# Patient Record
Sex: Female | Born: 1995 | Race: White | Hispanic: No | Marital: Married | Smoking: Former smoker
Health system: Southern US, Community
[De-identification: ages and names within clinical notes are randomized; demographics above are authoritative.]

## PROBLEM LIST (undated history)

## (undated) ENCOUNTER — Inpatient Hospital Stay (HOSPITAL_COMMUNITY): Payer: Self-pay

## (undated) DIAGNOSIS — F319 Bipolar disorder, unspecified: Secondary | ICD-10-CM

## (undated) DIAGNOSIS — F329 Major depressive disorder, single episode, unspecified: Secondary | ICD-10-CM

## (undated) DIAGNOSIS — F99 Mental disorder, not otherwise specified: Secondary | ICD-10-CM

## (undated) DIAGNOSIS — F32A Depression, unspecified: Secondary | ICD-10-CM

## (undated) DIAGNOSIS — R339 Retention of urine, unspecified: Secondary | ICD-10-CM

## (undated) DIAGNOSIS — R630 Anorexia: Secondary | ICD-10-CM

## (undated) DIAGNOSIS — D649 Anemia, unspecified: Secondary | ICD-10-CM

## (undated) DIAGNOSIS — F909 Attention-deficit hyperactivity disorder, unspecified type: Secondary | ICD-10-CM

## (undated) HISTORY — DX: Major depressive disorder, single episode, unspecified: F32.9

## (undated) HISTORY — DX: Bipolar disorder, unspecified: F31.9

## (undated) HISTORY — PX: EYE SURGERY: SHX253

## (undated) HISTORY — DX: Mental disorder, not otherwise specified: F99

## (undated) HISTORY — DX: Attention-deficit hyperactivity disorder, unspecified type: F90.9

## (undated) HISTORY — DX: Depression, unspecified: F32.A

## (undated) HISTORY — DX: Retention of urine, unspecified: R33.9

---

## 2015-08-16 NOTE — L&D Delivery Note (Signed)
20 G1 now P1 who was a post dates induction. Developed Tripple I and received Ampcillin and Gentamycin in labor.   Operative Delivery Note At 1:47 PM a viable female was delivered via VAVD .  Presentation: vertex; Position: Left,, Occiput,; Station: +2.  Verbal consent: obtained from patient.  Risks and benefits discussed in detail.  Risks include, but are not limited to the risks of anesthesia, bleeding, infection, damage to maternal tissues, fetal cephalhematoma.  There is also the risk of inability to effect vaginal delivery of the head, or shoulder dystocia that cannot be resolved by established maneuvers, leading to the need for emergency cesarean section.  APGAR: 5, 8; weight pending .   Placenta status: delivered with gentle traction intact.  Cord:  3 vessel with the following complications: small area of avulsion approximately 8cm from fetus bleeding immediately following delivery .  Cord pH: 7.2  Anesthesia:  epidural Instruments: kiwi vaccuum Episiotomy:  none Lacerations:  2nd degree posterior Suture Repair: 3.0 vicryl Est. Blood Loss (mL):  500  Mom to postpartum.  Baby to Couplet care / Skin to Skin.  Allison Shaw 05/19/2016, 2:21 PM

## 2015-10-28 LAB — OB RESULTS CONSOLE ANTIBODY SCREEN: Antibody Screen: NEGATIVE

## 2015-10-28 LAB — OB RESULTS CONSOLE ABO/RH: RH TYPE: POSITIVE

## 2015-10-28 LAB — OB RESULTS CONSOLE GC/CHLAMYDIA
CHLAMYDIA, DNA PROBE: NEGATIVE
Gonorrhea: NEGATIVE

## 2015-10-28 LAB — OB RESULTS CONSOLE HEPATITIS B SURFACE ANTIGEN: Hepatitis B Surface Ag: NEGATIVE

## 2015-10-28 LAB — OB RESULTS CONSOLE RUBELLA ANTIBODY, IGM: Rubella: IMMUNE

## 2015-10-28 LAB — OB RESULTS CONSOLE RPR: RPR: NONREACTIVE

## 2015-10-29 LAB — OB RESULTS CONSOLE PLATELET COUNT: Platelets: 283 10*3/uL

## 2015-10-29 LAB — OB RESULTS CONSOLE HGB/HCT, BLOOD
HEMATOCRIT: 41 %
Hemoglobin: 13.5 g/dL

## 2015-10-29 LAB — OB RESULTS CONSOLE HIV ANTIBODY (ROUTINE TESTING): HIV: NONREACTIVE

## 2015-11-25 LAB — CYSTIC FIBROSIS DIAGNOSTIC STUDY: Interpretation-CFDNA:: NEGATIVE

## 2016-02-18 LAB — OB RESULTS CONSOLE RPR: RPR: NONREACTIVE

## 2016-02-18 LAB — OB RESULTS CONSOLE HIV ANTIBODY (ROUTINE TESTING): HIV: NONREACTIVE

## 2016-03-16 ENCOUNTER — Encounter (HOSPITAL_COMMUNITY): Payer: Self-pay | Admitting: *Deleted

## 2016-03-16 ENCOUNTER — Inpatient Hospital Stay (HOSPITAL_COMMUNITY)
Admission: AD | Admit: 2016-03-16 | Discharge: 2016-03-16 | Disposition: A | Discharge status: Home / Self Care | Payer: Medicaid Other | Source: Ambulatory Visit | Attending: Obstetrics and Gynecology | Admitting: Obstetrics and Gynecology

## 2016-03-16 DIAGNOSIS — O99613 Diseases of the digestive system complicating pregnancy, third trimester: Secondary | ICD-10-CM | POA: Insufficient documentation

## 2016-03-16 DIAGNOSIS — R109 Unspecified abdominal pain: Secondary | ICD-10-CM | POA: Diagnosis present

## 2016-03-16 DIAGNOSIS — Z3A32 32 weeks gestation of pregnancy: Secondary | ICD-10-CM | POA: Diagnosis not present

## 2016-03-16 DIAGNOSIS — K219 Gastro-esophageal reflux disease without esophagitis: Secondary | ICD-10-CM | POA: Diagnosis not present

## 2016-03-16 LAB — URINALYSIS, ROUTINE W REFLEX MICROSCOPIC
BILIRUBIN URINE: NEGATIVE
Glucose, UA: NEGATIVE mg/dL
Hgb urine dipstick: NEGATIVE
Ketones, ur: NEGATIVE mg/dL
Leukocytes, UA: NEGATIVE
NITRITE: NEGATIVE
PROTEIN: NEGATIVE mg/dL
SPECIFIC GRAVITY, URINE: 1.02 (ref 1.005–1.030)
pH: 6 (ref 5.0–8.0)

## 2016-03-16 LAB — WET PREP, GENITAL
Clue Cells Wet Prep HPF POC: NONE SEEN
SPERM: NONE SEEN
TRICH WET PREP: NONE SEEN
YEAST WET PREP: NONE SEEN

## 2016-03-16 MED ORDER — FAMOTIDINE 40 MG PO TABS: 40.0000 mg | ORAL_TABLET | Freq: Every day | ORAL | 1 refills | Status: DC

## 2016-03-16 MED ORDER — GI COCKTAIL ~~LOC~~
30.0000 mL | Freq: Once | ORAL | Status: AC
  Administered 2016-03-16: 30 mL via ORAL
  Filled 2016-03-16: qty 30

## 2016-03-16 NOTE — Discharge Instructions (Signed)
Gastroesophageal Reflux Disease, Adult Normally, food travels down the esophagus and stays in the stomach to be digested. However, when a person has gastroesophageal reflux disease (GERD), food and stomach acid move back up into the esophagus. When this happens, the esophagus becomes sore and inflamed. Over time, GERD can create small holes (ulcers) in the lining of the esophagus.  CAUSES This condition is caused by a problem with the muscle between the esophagus and the stomach (lower esophageal sphincter, or LES). Normally, the LES muscle closes after food passes through the esophagus to the stomach. When the LES is weakened or abnormal, it does not close properly, and that allows food and stomach acid to go back up into the esophagus. The LES can be weakened by certain dietary substances, medicines, and medical conditions, including:  Tobacco use.  Pregnancy.  Having a hiatal hernia.  Heavy alcohol use.  Certain foods and beverages, such as coffee, chocolate, onions, and peppermint. RISK FACTORS This condition is more likely to develop in:  People who have an increased body weight.  People who have connective tissue disorders.  People who use NSAID medicines. SYMPTOMS Symptoms of this condition include:  Heartburn.  Difficult or painful swallowing.  The feeling of having a lump in the throat.  Abitter taste in the mouth.  Bad breath.  Having a large amount of saliva.  Having an upset or bloated stomach.  Belching.  Chest pain.  Shortness of breath or wheezing.  Ongoing (chronic) cough or a night-time cough.  Wearing away of tooth enamel.  Weight loss. Different conditions can cause chest pain. Make sure to see your health care provider if you experience chest pain. DIAGNOSIS Your health care provider will take a medical history and perform a physical exam. To determine if you have mild or severe GERD, your health care provider may also monitor how you respond  to treatment. You may also have other tests, including:  An endoscopy toexamine your stomach and esophagus with a small camera.  A test thatmeasures the acidity level in your esophagus.  A test thatmeasures how much pressure is on your esophagus.  A barium swallow or modified barium swallow to show the shape, size, and functioning of your esophagus. TREATMENT The goal of treatment is to help relieve your symptoms and to prevent complications. Treatment for this condition may vary depending on how severe your symptoms are. Your health care provider may recommend:  Changes to your diet.  Medicine.  Surgery. HOME CARE INSTRUCTIONS Diet  Follow a diet as recommended by your health care provider. This may involve avoiding foods and drinks such as:  Coffee and tea (with or without caffeine).  Drinks that containalcohol.  Energy drinks and sports drinks.  Carbonated drinks or sodas.  Chocolate and cocoa.  Peppermint and mint flavorings.  Garlic and onions.  Horseradish.  Spicy and acidic foods, including peppers, chili powder, curry powder, vinegar, hot sauces, and barbecue sauce.  Citrus fruit juices and citrus fruits, such as oranges, lemons, and limes.  Tomato-based foods, such as red sauce, chili, salsa, and pizza with red sauce.  Fried and fatty foods, such as donuts, french fries, potato chips, and high-fat dressings.  High-fat meats, such as hot dogs and fatty cuts of red and white meats, such as rib eye steak, sausage, ham, and bacon.  High-fat dairy items, such as whole milk, butter, and cream cheese.  Eat small, frequent meals instead of large meals.  Avoid drinking large amounts of liquid with your  meals.  Avoid eating meals during the 2-3 hours before bedtime.  Avoid lying down right after you eat.  Do not exercise right after you eat. General Instructions  Pay attention to any changes in your symptoms.  Take over-the-counter and prescription  medicines only as told by your health care provider. Do not take aspirin, ibuprofen, or other NSAIDs unless your health care provider told you to do so.  Do not use any tobacco products, including cigarettes, chewing tobacco, and e-cigarettes. If you need help quitting, ask your health care provider.  Wear loose-fitting clothing. Do not wear anything tight around your waist that causes pressure on your abdomen.  Raise (elevate) the head of your bed 6 inches (15cm).  Try to reduce your stress, such as with yoga or meditation. If you need help reducing stress, ask your health care provider.  If you are overweight, reduce your weight to an amount that is healthy for you. Ask your health care provider for guidance about a safe weight loss goal.  Keep all follow-up visits as told by your health care provider. This is important. SEEK MEDICAL CARE IF:  You have new symptoms.  You have unexplained weight loss.  You have difficulty swallowing, or it hurts to swallow.  You have wheezing or a persistent cough.  Your symptoms do not improve with treatment.  You have a hoarse voice. SEEK IMMEDIATE MEDICAL CARE IF:  You have pain in your arms, neck, jaw, teeth, or back.  You feel sweaty, dizzy, or light-headed.  You have chest pain or shortness of breath.  You vomit and your vomit looks like blood or coffee grounds.  You faint.  Your stool is bloody or black.  You cannot swallow, drink, or eat.   This information is not intended to replace advice given to you by your health care provider. Make sure you discuss any questions you have with your health care provider.   Document Released: 05/11/2005 Document Revised: 04/22/2015 Document Reviewed: 11/26/2014 Elsevier Interactive Patient Education 2016 [employer]. Preterm Labor Information Preterm labor is when labor starts at less than 37 weeks of pregnancy. The normal length of a pregnancy is 39 to 41 weeks. CAUSES Often, there is  no identifiable underlying cause as to why a woman goes into preterm labor. One of the most common known causes of preterm labor is infection. Infections of the uterus, cervix, vagina, amniotic sac, bladder, kidney, or even the lungs (pneumonia) can cause labor to start. Other suspected causes of preterm labor include:   Urogenital infections, such as yeast infections and bacterial vaginosis.   Uterine abnormalities (uterine shape, uterine septum, fibroids, or bleeding from the placenta).   A cervix that has been operated on (it may fail to stay closed).   Malformations in the fetus.   Multiple gestations (twins, triplets, and so on).   Breakage of the amniotic sac.  RISK FACTORS  Having a previous history of preterm labor.   Having premature rupture of membranes (PROM).   Having a placenta that covers the opening of the cervix (placenta previa).   Having a placenta that separates from the uterus (placental abruption).   Having a cervix that is too weak to hold the fetus in the uterus (incompetent cervix).   Having too much fluid in the amniotic sac (polyhydramnios).   Taking illegal drugs or smoking while pregnant.   Not gaining enough weight while pregnant.   Being younger than 30 and older than 20 years old.   Having  a low socioeconomic status.   Being African American. SYMPTOMS Signs and symptoms of preterm labor include:   Menstrual-like cramps, abdominal pain, or back pain.  Uterine contractions that are regular, as frequent as six in an hour, regardless of their intensity (may be mild or painful).  Contractions that start on the top of the uterus and spread down to the lower abdomen and back.   A sense of increased pelvic pressure.   A watery or bloody mucus discharge that comes from the vagina.  TREATMENT Depending on the length of the pregnancy and other circumstances, your health care provider may suggest bed rest. If necessary, there are  medicines that can be given to stop contractions and to mature the fetal lungs. If labor happens before 34 weeks of pregnancy, a prolonged hospital stay may be recommended. Treatment depends on the condition of both you and the fetus.  WHAT SHOULD YOU DO IF YOU THINK YOU ARE IN PRETERM LABOR? Call your health care provider right away. You will need to go to the hospital to get checked immediately. HOW CAN YOU PREVENT PRETERM LABOR IN FUTURE PREGNANCIES? You should:   Stop smoking if you smoke.  Maintain healthy weight gain and avoid chemicals and drugs that are not necessary.  Be watchful for any type of infection.  Inform your health care provider if you have a known history of preterm labor.   This information is not intended to replace advice given to you by your health care provider. Make sure you discuss any questions you have with your health care provider.   Document Released: 10/22/2003 Document Revised: 04/03/2013 Document Reviewed: 09/03/2012 Elsevier Interactive Patient Education [employer]

## 2016-03-16 NOTE — MAU Note (Signed)
Pain in stomach

## 2016-03-16 NOTE — MAU Provider Note (Signed)
History     CSN: [id number]  Arrival date and time: 03/16/16 1945   First Provider Initiated Contact with Patient 03/16/16 2009      Chief Complaint  Patient presents with  . Abdominal Pain   Allison Shaw is a 20 y.o. G1P0 at [redacted]w[redacted]d who presents today with abdominal pain. She states that the pain started this morning. She denies any VB or LOF. She reports that the fetus is moving normally. She states that she was seen in [facility] for "leaking of fluid" in June, and states that they told her that her water was not broken. She denies any complications with the pregnancy at this time.    Abdominal Pain  This is a new problem. The current episode started today (at 0500 ). The onset quality is sudden. The problem occurs constantly. The problem has been unchanged. The pain is located in the epigastric region. The pain is at a severity of 6/10. The quality of the pain is sharp. The abdominal pain radiates to the suprapubic region. Pertinent negatives include no constipation, diarrhea, dysuria, fever, frequency, nausea or vomiting. Nothing aggravates the pain. The pain is relieved by nothing. She has tried nothing for the symptoms.    No past medical history on file.  No past surgical history on file.  No family history on file.  Social History  Substance Use Topics  . Smoking status: Not on file  . Smokeless tobacco: Not on file  . Alcohol use Not on file    Allergies:  Allergies  Allergen Reactions  . Latex Itching and Other (See Comments)    Causes burning.  . Fish Allergy Rash    Prescriptions Prior to Admission  Medication Sig Dispense Refill Last Dose  . acetaminophen (TYLENOL) 325 MG tablet Take 325 mg by mouth every 6 (six) hours as needed for mild pain or headache.   03/16/2016 at Unknown time  . calcium carbonate (TUMS - DOSED IN MG ELEMENTAL CALCIUM) 500 MG chewable tablet Chew 1 tablet by mouth 2 (two) times daily as needed for indigestion or heartburn.   03/16/2016 at  Unknown time  . Prenatal Vit-Fe Fumarate-FA (PRENATAL MULTIVITAMIN) TABS tablet Take 1 tablet by mouth at bedtime.   03/15/2016 at Unknown time    Review of Systems  Constitutional: Negative for chills and fever.  Gastrointestinal: Positive for abdominal pain. Negative for constipation, diarrhea, nausea and vomiting.  Genitourinary: Negative for dysuria, frequency and urgency.   Physical Exam   Blood pressure 129/73, pulse 99, temperature 97.5 F (36.4 C), temperature source Oral, resp. rate 18.  Physical Exam  Nursing note and vitals reviewed. Constitutional: She is oriented to person, place, and time. She appears well-developed and well-nourished. No distress.  HENT:  Head: Normocephalic.  Cardiovascular: Normal rate.   Respiratory: Effort normal.  GI: Soft. There is no tenderness. There is no rebound.  Genitourinary:  Genitourinary Comments:  External: no lesion Vagina: small amount of white discharge Cervix: pink, smooth, closed/thick  Uterus: AGA   Neurological: She is alert and oriented to person, place, and time.  Skin: Skin is warm and dry.  Psychiatric: She has a normal mood and affect.   FHT: 135, moderate with 15x15 accels, no decels Toco: no UCs  MAU Course  Procedures  MDM Patient has had GI cocktail. She reports that her pain is now 0/10   Assessment and Plan   1. Gastroesophageal reflux disease, esophagitis presence not specified   2. [redacted] weeks gestation of pregnancy  DC home Comfort measures reviewed  3rd Trimester precautions  PTL precautions  Fetal kick counts RX: pepcid QD #30  Return to MAU as needed FU with OB as planned  Follow-up Information    Lin Landsman, MD .   Specialty:  Obstetrics and Gynecology Contact information: [address] [place]  [place] [address] [phone]            Tawnya Crook 03/16/2016, 8:13 PM

## 2016-03-17 LAB — GC/CHLAMYDIA PROBE AMP (~~LOC~~) NOT AT ARMC
Chlamydia: NEGATIVE
NEISSERIA GONORRHEA: NEGATIVE

## 2016-03-23 ENCOUNTER — Ambulatory Visit (INDEPENDENT_AMBULATORY_CARE_PROVIDER_SITE_OTHER): Payer: Medicaid Other | Admitting: Family Medicine

## 2016-03-23 ENCOUNTER — Encounter: Payer: Self-pay | Admitting: Family Medicine

## 2016-03-23 DIAGNOSIS — O358XX0 Maternal care for other (suspected) fetal abnormality and damage, not applicable or unspecified: Secondary | ICD-10-CM

## 2016-03-23 DIAGNOSIS — O0993 Supervision of high risk pregnancy, unspecified, third trimester: Secondary | ICD-10-CM

## 2016-03-23 DIAGNOSIS — O35BXX0 Maternal care for other (suspected) fetal abnormality and damage, fetal cardiac anomalies, not applicable or unspecified: Secondary | ICD-10-CM

## 2016-03-23 LAB — POCT URINALYSIS DIP (DEVICE)
Bilirubin Urine: NEGATIVE
Glucose, UA: NEGATIVE mg/dL
Hgb urine dipstick: NEGATIVE
KETONES UR: NEGATIVE mg/dL
Leukocytes, UA: NEGATIVE
Nitrite: NEGATIVE
PH: 7 (ref 5.0–8.0)
PROTEIN: NEGATIVE mg/dL
Specific Gravity, Urine: 1.02 (ref 1.005–1.030)
Urobilinogen, UA: 1 mg/dL (ref 0.0–1.0)

## 2016-03-24 DIAGNOSIS — O0990 Supervision of high risk pregnancy, unspecified, unspecified trimester: Secondary | ICD-10-CM | POA: Insufficient documentation

## 2016-03-24 DIAGNOSIS — O35BXX0 Maternal care for other (suspected) fetal abnormality and damage, fetal cardiac anomalies, not applicable or unspecified: Secondary | ICD-10-CM | POA: Insufficient documentation

## 2016-03-24 DIAGNOSIS — O358XX0 Maternal care for other (suspected) fetal abnormality and damage, not applicable or unspecified: Secondary | ICD-10-CM | POA: Insufficient documentation

## 2016-03-24 NOTE — Patient Instructions (Addendum)
Breastfeeding Deciding to breastfeed is one of the best choices you can make for you and your baby. A change in hormones during pregnancy causes your breast tissue to grow and increases the number and size of your milk ducts. These hormones also allow proteins, sugars, and fats from your blood supply to make breast milk in your milk-producing glands. Hormones prevent breast milk from being released before your baby is born as well as prompt milk flow after birth. Once breastfeeding has begun, thoughts of your baby, as well as his or her sucking or crying, can stimulate the release of milk from your milk-producing glands.  BENEFITS OF BREASTFEEDING For Your Baby  Your first milk (colostrum) helps your baby's digestive system function better.  There are antibodies in your milk that help your baby fight off infections.  Your baby has a lower incidence of asthma, allergies, and sudden infant death syndrome.  The nutrients in breast milk are better for your baby than infant formulas and are designed uniquely for your baby's needs.  Breast milk improves your baby's brain development.  Your baby is less likely to develop other conditions, such as childhood obesity, asthma, or type 2 diabetes mellitus. For You  Breastfeeding helps to create a very special bond between you and your baby.  Breastfeeding is convenient. Breast milk is always available at the correct temperature and costs nothing.  Breastfeeding helps to burn calories and helps you lose the weight gained during pregnancy.  Breastfeeding makes your uterus contract to its prepregnancy size faster and slows bleeding (lochia) after you give birth.   Breastfeeding helps to lower your risk of developing type 2 diabetes mellitus, osteoporosis, and breast or ovarian cancer later in life. SIGNS THAT YOUR BABY IS HUNGRY Early Signs of Hunger  Increased alertness or activity.  Stretching.  Movement of the head from side to  side.  Movement of the head and opening of the mouth when the corner of the mouth or cheek is stroked (rooting).  Increased sucking sounds, smacking lips, cooing, sighing, or squeaking.  Hand-to-mouth movements.  Increased sucking of fingers or hands. Late Signs of Hunger  Fussing.  Intermittent crying. Extreme Signs of Hunger Signs of extreme hunger will require calming and consoling before your baby will be able to breastfeed successfully. Do not wait for the following signs of extreme hunger to occur before you initiate breastfeeding:  Restlessness.  A loud, strong cry.  Screaming. BREASTFEEDING BASICS Breastfeeding Initiation  Find a comfortable place to sit or lie down, with your neck and back well supported.  Place a pillow or rolled up blanket under your baby to bring him or her to the level of your breast (if you are seated). Nursing pillows are specially designed to help support your arms and your baby while you breastfeed.  Make sure that your baby's abdomen is facing your abdomen.  Gently massage your breast. With your fingertips, massage from your chest wall toward your nipple in a circular motion. This encourages milk flow. You may need to continue this action during the feeding if your milk flows slowly.  Support your breast with 4 fingers underneath and your thumb above your nipple. Make sure your fingers are well away from your nipple and your baby's mouth.  Stroke your baby's lips gently with your finger or nipple.  When your baby's mouth is open wide enough, quickly bring your baby to your breast, placing your entire nipple and as much of the colored area around your nipple (  areola) as possible into your baby's mouth.  More areola should be visible above your baby's upper lip than below the lower lip.  Your baby's tongue should be between his or her lower gum and your breast.  Ensure that your baby's mouth is correctly positioned around your nipple  (latched). Your baby's lips should create a seal on your breast and be turned out (everted).  It is common for your baby to suck about 2-3 minutes in order to start the flow of breast milk. Latching Teaching your baby how to latch on to your breast properly is very important. An improper latch can cause nipple pain and decreased milk supply for you and poor weight gain in your baby. Also, if your baby is not latched onto your nipple properly, he or she may swallow some air during feeding. This can make your baby fussy. Burping your baby when you switch breasts during the feeding can help to get rid of the air. However, teaching your baby to latch on properly is still the best way to prevent fussiness from swallowing air while breastfeeding. Signs that your baby has successfully latched on to your nipple:  Silent tugging or silent sucking, without causing you pain.  Swallowing heard between every 3-4 sucks.  Muscle movement above and in front of his or her ears while sucking. Signs that your baby has not successfully latched on to nipple:  Sucking sounds or smacking sounds from your baby while breastfeeding.  Nipple pain. If you think your baby has not latched on correctly, slip your finger into the corner of your baby's mouth to break the suction and place it between your baby's gums. Attempt breastfeeding initiation again. Signs of Successful Breastfeeding Signs from your baby:  A gradual decrease in the number of sucks or complete cessation of sucking.  Falling asleep.  Relaxation of his or her body.  Retention of a small amount of milk in his or her mouth.  Letting go of your breast by himself or herself. Signs from you:  Breasts that have increased in firmness, weight, and size 1-3 hours after feeding.  Breasts that are softer immediately after breastfeeding.  Increased milk volume, as well as a change in milk consistency and color by the fifth day of breastfeeding.  Nipples  that are not sore, cracked, or bleeding. Signs That Your Baby is Getting Enough Milk  Wetting at least 3 diapers in a 24-hour period. The urine should be clear and pale yellow by age 5 days.  At least 3 stools in a 24-hour period by age 5 days. The stool should be soft and yellow.  At least 3 stools in a 24-hour period by age 7 days. The stool should be seedy and yellow.  No loss of weight greater than 10% of birth weight during the first 3 days of age.  Average weight gain of 4-7 ounces (113-198 g) per week after age 4 days.  Consistent daily weight gain by age 5 days, without weight loss after the age of 2 weeks. After a feeding, your baby may spit up a small amount. This is common. BREASTFEEDING FREQUENCY AND DURATION Frequent feeding will help you make more milk and can prevent sore nipples and breast engorgement. Breastfeed when you feel the need to reduce the fullness of your breasts or when your baby shows signs of hunger. This is called "breastfeeding on demand." Avoid introducing a pacifier to your baby while you are working to establish breastfeeding (the first 4-6 weeks   after your baby is born). After this time you may choose to use a pacifier. Research has shown that pacifier use during the first year of a baby's life decreases the risk of sudden infant death syndrome (SIDS). Allow your baby to feed on each breast as long as he or she wants. Breastfeed until your baby is finished feeding. When your baby unlatches or falls asleep while feeding from the first breast, offer the second breast. Because newborns are often sleepy in the first few weeks of life, you may need to awaken your baby to get him or her to feed. Breastfeeding times will vary from baby to baby. However, the following rules can serve as a guide to help you ensure that your baby is properly fed:  Newborns (babies 4 weeks of age or younger) may breastfeed every 1-3 hours.  Newborns should not go longer than 3 hours  during the day or 5 hours during the night without breastfeeding.  You should breastfeed your baby a minimum of 8 times in a 24-hour period until you begin to introduce solid foods to your baby at around 6 months of age. BREAST MILK PUMPING Pumping and storing breast milk allows you to ensure that your baby is exclusively fed your breast milk, even at times when you are unable to breastfeed. This is especially important if you are going back to work while you are still breastfeeding or when you are not able to be present during feedings. Your lactation consultant can give you guidelines on how long it is safe to store breast milk. A breast pump is a machine that allows you to pump milk from your breast into a sterile bottle. The pumped breast milk can then be stored in a refrigerator or freezer. Some breast pumps are operated by hand, while others use electricity. Ask your lactation consultant which type will work best for you. Breast pumps can be purchased, but some hospitals and breastfeeding support groups lease breast pumps on a monthly basis. A lactation consultant can teach you how to hand express breast milk, if you prefer not to use a pump. CARING FOR YOUR BREASTS WHILE YOU BREASTFEED Nipples can become dry, cracked, and sore while breastfeeding. The following recommendations can help keep your breasts moisturized and healthy:  Avoid using soap on your nipples.  Wear a supportive bra. Although not required, special nursing bras and tank tops are designed to allow access to your breasts for breastfeeding without taking off your entire bra or top. Avoid wearing underwire-style bras or extremely tight bras.  Air dry your nipples for 3-4minutes after each feeding.  Use only cotton bra pads to absorb leaked breast milk. Leaking of breast milk between feedings is normal.  Use lanolin on your nipples after breastfeeding. Lanolin helps to maintain your skin's normal moisture barrier. If you use  pure lanolin, you do not need to wash it off before feeding your baby again. Pure lanolin is not toxic to your baby. You may also hand express a few drops of breast milk and gently massage that milk into your nipples and allow the milk to air dry. In the first few weeks after giving birth, some women experience extremely full breasts (engorgement). Engorgement can make your breasts feel heavy, warm, and tender to the touch. Engorgement peaks within 3-5 days after you give birth. The following recommendations can help ease engorgement:  Completely empty your breasts while breastfeeding or pumping. You may want to start by applying warm, moist heat (in   the shower or with warm water-soaked hand towels) just before feeding or pumping. This increases circulation and helps the milk flow. If your baby does not completely empty your breasts while breastfeeding, pump any extra milk after he or she is finished.  Wear a snug bra (nursing or regular) or tank top for 1-2 days to signal your body to slightly decrease milk production.  Apply ice packs to your breasts, unless this is too uncomfortable for you.  Make sure that your baby is latched on and positioned properly while breastfeeding. If engorgement persists after 48 hours of following these recommendations, contact your health care provider or a lactation consultant. OVERALL HEALTH CARE RECOMMENDATIONS WHILE BREASTFEEDING  Eat healthy foods. Alternate between meals and snacks, eating 3 of each per day. Because what you eat affects your breast milk, some of the foods may make your baby more irritable than usual. Avoid eating these foods if you are sure that they are negatively affecting your baby.  Drink milk, fruit juice, and water to satisfy your thirst (about 10 glasses a day).  Rest often, relax, and continue to take your prenatal vitamins to prevent fatigue, stress, and anemia.  Continue breast self-awareness checks.  Avoid chewing and smoking  tobacco. Chemicals from cigarettes that pass into breast milk and exposure to secondhand smoke may harm your baby.  Avoid alcohol and drug use, including marijuana. Some medicines that may be harmful to your baby can pass through breast milk. It is important to ask your health care provider before taking any medicine, including all over-the-counter and prescription medicine as well as vitamin and herbal supplements. It is possible to become pregnant while breastfeeding. If birth control is desired, ask your health care provider about options that will be safe for your baby. SEEK MEDICAL CARE IF:  You feel like you want to stop breastfeeding or have become frustrated with breastfeeding.  You have painful breasts or nipples.  Your nipples are cracked or bleeding.  Your breasts are red, tender, or warm.  You have a swollen area on either breast.  You have a fever or chills.  You have nausea or vomiting.  You have drainage other than breast milk from your nipples.  Your breasts do not become full before feedings by the fifth day after you give birth.  You feel sad and depressed.  Your baby is too sleepy to eat well.  Your baby is having trouble sleeping.   Your baby is wetting less than 3 diapers in a 24-hour period.  Your baby has less than 3 stools in a 24-hour period.  Your baby's skin or the white part of his or her eyes becomes yellow.   Your baby is not gaining weight by 5 days of age. SEEK IMMEDIATE MEDICAL CARE IF:  Your baby is overly tired (lethargic) and does not want to wake up and feed.  Your baby develops an unexplained fever.   This information is not intended to replace advice given to you by your health care provider. Make sure you discuss any questions you have with your health care provider.   Document Released: 08/01/2005 Document Revised: 04/22/2015 Document Reviewed: 01/23/2013 Elsevier Interactive Patient Education 2016 [employer]  

## 2016-03-24 NOTE — Progress Notes (Signed)
Subjective:  Allison Shaw is a 20 y.o. G1P0 at 6029w2d being seen today for ongoing prenatal care.  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  Patient reports no complaints.  Contractions: Not present. Vag. Bleeding: None.  Movement: Present. Denies leaking of fluid.   The following portions of the patient's history were reviewed and updated as appropriate: allergies, current medications, past family history, past medical history, past social history, past surgical history and problem list. Problem list updated.  Objective:   Vitals:   03/23/16 1321 03/23/16 1339  BP: (!) 120/57   Pulse: 85   Weight: 145 lb 14.4 oz (66.2 kg)   Height:  5\' 3"  (1.6 m)    Fetal Status:     Movement: Present     General:  Alert, oriented and cooperative. Patient is in no acute distress.  Skin: Skin is warm and dry. No rash noted.   Cardiovascular: Normal heart rate noted  Respiratory: Normal respiratory effort, no problems with respiration noted  Abdomen: Soft, gravid, appropriate for gestational age. Pain/Pressure: Absent     Pelvic:  Cervical exam deferred        Extremities: Normal range of motion.  Edema: None  Mental Status: Normal mood and affect. Normal behavior. Normal judgment and thought content.   Urinalysis:      Assessment and Plan:  Pregnancy: G1P0 at 3529w2d  1. Supervision of high risk pregnancy, antepartum, third trimester Continue prenatal care. Need labs and records of u/s needed    2. 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 She has Fetal ECHO's with [facility]  Preterm labor symptoms and general obstetric precautions including but not limited to vaginal bleeding, contractions,  leaking of fluid and fetal movement were reviewed in detail with the patient. Please refer to After Visit Summary for other counseling recommendations.  Return in 2 weeks (on 04/06/2016).   Reva Boresanya S Zelig Gacek, MD

## 2016-03-25 ENCOUNTER — Encounter (HOSPITAL_COMMUNITY): Payer: Self-pay

## 2016-04-06 ENCOUNTER — Ambulatory Visit (INDEPENDENT_AMBULATORY_CARE_PROVIDER_SITE_OTHER): Payer: Medicaid Other | Admitting: Advanced Practice Midwife

## 2016-04-06 DIAGNOSIS — O35BXX0 Maternal care for other (suspected) fetal abnormality and damage, fetal cardiac anomalies, not applicable or unspecified: Secondary | ICD-10-CM

## 2016-04-06 DIAGNOSIS — O358XX0 Maternal care for other (suspected) fetal abnormality and damage, not applicable or unspecified: Secondary | ICD-10-CM

## 2016-04-06 DIAGNOSIS — O0993 Supervision of high risk pregnancy, unspecified, third trimester: Secondary | ICD-10-CM

## 2016-04-06 LAB — POCT URINALYSIS DIP (DEVICE)
BILIRUBIN URINE: NEGATIVE
Glucose, UA: NEGATIVE mg/dL
Hgb urine dipstick: NEGATIVE
Ketones, ur: NEGATIVE mg/dL
NITRITE: NEGATIVE
PH: 6.5 (ref 5.0–8.0)
Protein, ur: NEGATIVE mg/dL
Specific Gravity, Urine: 1.02 (ref 1.005–1.030)
UROBILINOGEN UA: 0.2 mg/dL (ref 0.0–1.0)

## 2016-04-06 NOTE — Progress Notes (Signed)
Subjective:  Allison Shaw is a 20 y.o. G1P0 at 3932w1d being seen today for ongoing prenatal care.  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  Patient reports occasional contractions.  Contractions: Irritability. Vag. Bleeding: None.  Movement: Present. Denies leaking of fluid.   The following portions of the patient's history were reviewed and updated as appropriate: allergies, current medications, past family history, past medical history, past social history, past surgical history and problem list. Problem list updated.  Objective:   Vitals:   04/06/16 0846  BP: 120/69  Pulse: (!) 108  Weight: 150 lb (68 kg)    Fetal Status: Fetal Heart Rate (bpm): 140 Fundal Height: 35 cm Movement: Present     General:  Alert, oriented and cooperative. Patient is in no acute distress.  Skin: Skin is warm and dry. No rash noted.   Cardiovascular: Normal heart rate noted  Respiratory: Normal respiratory effort, no problems with respiration noted  Abdomen: Soft, gravid, appropriate for gestational age. Pain/Pressure: Absent     Pelvic:  Cervical exam deferred        Extremities: Normal range of motion.  Edema: None  Mental Status: Normal mood and affect. Normal behavior. Normal judgment and thought content.   Urinalysis: Urine Protein: Negative Urine Glucose: Negative  Assessment and Plan:  Pregnancy: G1P0 at 4932w1d  1. Supervision of high risk pregnancy, antepartum, third trimester   2. 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   Preterm labor symptoms and general obstetric precautions including but not limited to vaginal bleeding, contractions, leaking of fluid and fetal movement were reviewed in detail with the  patient. Please refer to After Visit Summary for other counseling recommendations.  Return in about 1 week (around 04/13/2016).   Dorathy KinsmanVirginia Dustine Bertini, CNM

## 2016-04-06 NOTE — Patient Instructions (Addendum)
Braxton Hicks Contractions Contractions of the uterus can occur throughout pregnancy. Contractions are not always a sign that you are in labor.  WHAT ARE BRAXTON HICKS CONTRACTIONS?  Contractions that occur before labor are called Braxton Hicks contractions, or false labor. Toward the end of pregnancy (32-34 weeks), these contractions can develop more often and may become more forceful. This is not true labor because these contractions do not result in opening (dilatation) and thinning of the cervix. They are sometimes difficult to tell apart from true labor because these contractions can be forceful and people have different pain tolerances. You should not feel embarrassed if you go to the hospital with false labor. Sometimes, the only way to tell if you are in true labor is for your health care provider to look for changes in the cervix. If there are no prenatal problems or other health problems associated with the pregnancy, it is completely safe to be sent home with false labor and await the onset of true labor. HOW CAN YOU TELL THE DIFFERENCE BETWEEN TRUE AND FALSE LABOR? False Labor  The contractions of false labor are usually shorter and not as hard as those of true labor.   The contractions are usually irregular.   The contractions are often felt in the front of the lower abdomen and in the groin.   The contractions may go away when you walk around or change positions while lying down.   The contractions get weaker and are shorter lasting as time goes on.   The contractions do not usually become progressively stronger, regular, and closer together as with true labor.  True Labor  Contractions in true labor last 30-70 seconds, become very regular, usually become more intense, and increase in frequency.   The contractions do not go away with walking.   The discomfort is usually felt in the top of the uterus and spreads to the lower abdomen and low back.   True labor can be  determined by your health care provider with an exam. This will show that the cervix is dilating and getting thinner.  WHAT TO REMEMBER  Keep up with your usual exercises and follow other instructions given by your health care provider.   Take medicines as directed by your health care provider.   Keep your regular prenatal appointments.   Eat and drink lightly if you think you are going into labor.   If Braxton Hicks contractions are making you uncomfortable:   Change your position from lying down or resting to walking, or from walking to resting.   Sit and rest in a tub of warm water.   Drink 2-3 glasses of water. Dehydration may cause these contractions.   Do slow and deep breathing several times an hour.  WHEN SHOULD I SEEK IMMEDIATE MEDICAL CARE? Seek immediate medical care if:  Your contractions become stronger, more regular, and closer together.   You have fluid leaking or gushing from your vagina.   You have a fever.   You pass blood-tinged mucus.   You have vaginal bleeding.   You have continuous abdominal pain.   You have low back pain that you never had before.   You feel your baby's head pushing down and causing pelvic pressure.   Your baby is not moving as much as it used to.    This information is not intended to replace advice given to you by your health care provider. Make sure you discuss any questions you have with your health care  provider.   Document Released: 08/01/2005 Document Revised: 08/06/2013 Document Reviewed: 05/13/2013 Elsevier Interactive Patient Education 2016 Elsevier Inc.  Contraception Choices Contraception (birth control) is the use of any methods or devices to prevent pregnancy. Below are some methods to help avoid pregnancy. HORMONAL METHODS   Contraceptive implant. This is a thin, plastic tube containing progesterone hormone. It does not contain estrogen hormone. Your health care provider inserts the tube in  the inner part of the upper arm. The tube can remain in place for up to 3 years. After 3 years, the implant must be removed. The implant prevents the ovaries from releasing an egg (ovulation), thickens the cervical mucus to prevent sperm from entering the uterus, and thins the lining of the inside of the uterus.  Progesterone-only injections. These injections are given every 3 months by your health care provider to prevent pregnancy. This synthetic progesterone hormone stops the ovaries from releasing eggs. It also thickens cervical mucus and changes the uterine lining. This makes it harder for sperm to survive in the uterus.  Birth control pills. These pills contain estrogen and progesterone hormone. They work by preventing the ovaries from releasing eggs (ovulation). They also cause the cervical mucus to thicken, preventing the sperm from entering the uterus. Birth control pills are prescribed by a health care provider.Birth control pills can also be used to treat heavy periods.  Minipill. This type of birth control pill contains only the progesterone hormone. They are taken every day of each month and must be prescribed by your health care provider.  Birth control patch. The patch contains hormones similar to those in birth control pills. It must be changed once a week and is prescribed by a health care provider.  Vaginal ring. The ring contains hormones similar to those in birth control pills. It is left in the vagina for 3 weeks, removed for 1 week, and then a new one is put back in place. The patient must be comfortable inserting and removing the ring from the vagina.A health care provider's prescription is necessary.  Emergency contraception. Emergency contraceptives prevent pregnancy after unprotected sexual intercourse. This pill can be taken right after sex or up to 5 days after unprotected sex. It is most effective the sooner you take the pills after having sexual intercourse. Most emergency  contraceptive pills are available without a prescription. Check with your pharmacist. Do not use emergency contraception as your only form of birth control. BARRIER METHODS   Female condom. This is a thin sheath (latex or rubber) that is worn over the penis during sexual intercourse. It can be used with spermicide to increase effectiveness.  Female condom. This is a soft, loose-fitting sheath that is put into the vagina before sexual intercourse.  Diaphragm. This is a soft, latex, dome-shaped barrier that must be fitted by a health care provider. It is inserted into the vagina, along with a spermicidal jelly. It is inserted before intercourse. The diaphragm should be left in the vagina for 6 to 8 hours after intercourse.  Cervical cap. This is a round, soft, latex or plastic cup that fits over the cervix and must be fitted by a health care provider. The cap can be left in place for up to 48 hours after intercourse.  Sponge. This is a soft, circular piece of polyurethane foam. The sponge has spermicide in it. It is inserted into the vagina after wetting it and before sexual intercourse.  Spermicides. These are chemicals that kill or block sperm from entering   the cervix and uterus. They come in the form of creams, jellies, suppositories, foam, or tablets. They do not require a prescription. They are inserted into the vagina with an applicator before having sexual intercourse. The process must be repeated every time you have sexual intercourse. INTRAUTERINE CONTRACEPTION  Intrauterine device (IUD). This is a T-shaped device that is put in a woman's uterus during a menstrual period to prevent pregnancy. There are 2 types:  Copper IUD. This type of IUD is wrapped in copper wire and is placed inside the uterus. Copper makes the uterus and fallopian tubes produce a fluid that kills sperm. It can stay in place for 10 years.  Hormone IUD. This type of IUD contains the hormone progestin (synthetic  progesterone). The hormone thickens the cervical mucus and prevents sperm from entering the uterus, and it also thins the uterine lining to prevent implantation of a fertilized egg. The hormone can weaken or kill the sperm that get into the uterus. It can stay in place for 3-5 years, depending on which type of IUD is used. PERMANENT METHODS OF CONTRACEPTION  Female tubal ligation. This is when the woman's fallopian tubes are surgically sealed, tied, or blocked to prevent the egg from traveling to the uterus.  Hysteroscopic sterilization. This involves placing a small coil or insert into each fallopian tube. Your doctor uses a technique called hysteroscopy to do the procedure. The device causes scar tissue to form. This results in permanent blockage of the fallopian tubes, so the sperm cannot fertilize the egg. It takes about 3 months after the procedure for the tubes to become blocked. You must use another form of birth control for these 3 months.  Female sterilization. This is when the female has the tubes that carry sperm tied off (vasectomy).This blocks sperm from entering the vagina during sexual intercourse. After the procedure, the man can still ejaculate fluid (semen). NATURAL PLANNING METHODS  Natural family planning. This is not having sexual intercourse or using a barrier method (condom, diaphragm, cervical cap) on days the woman could become pregnant.  Calendar method. This is keeping track of the length of each menstrual cycle and identifying when you are fertile.  Ovulation method. This is avoiding sexual intercourse during ovulation.  Symptothermal method. This is avoiding sexual intercourse during ovulation, using a thermometer and ovulation symptoms.  Post-ovulation method. This is timing sexual intercourse after you have ovulated. Regardless of which type or method of contraception you choose, it is important that you use condoms to protect against the transmission of sexually  transmitted infections (STIs). Talk with your health care provider about which form of contraception is most appropriate for you.   This information is not intended to replace advice given to you by your health care provider. Make sure you discuss any questions you have with your health care provider.   Document Released: 08/01/2005 Document Revised: 08/06/2013 Document Reviewed: 01/24/2013 Elsevier Interactive Patient Education [employer]2016 Elsevier Inc.

## 2016-04-07 ENCOUNTER — Encounter: Payer: Self-pay | Admitting: *Deleted

## 2016-04-07 LAB — OB RESULTS CONSOLE HGB/HCT, BLOOD: HEMOGLOBIN: 11 g/dL

## 2016-04-08 ENCOUNTER — Encounter: Payer: Self-pay | Admitting: *Deleted

## 2016-04-20 DIAGNOSIS — F909 Attention-deficit hyperactivity disorder, unspecified type: Secondary | ICD-10-CM | POA: Insufficient documentation

## 2016-04-20 DIAGNOSIS — F3181 Bipolar II disorder: Secondary | ICD-10-CM | POA: Insufficient documentation

## 2016-04-26 ENCOUNTER — Ambulatory Visit (INDEPENDENT_AMBULATORY_CARE_PROVIDER_SITE_OTHER): Payer: Medicaid Other | Admitting: Advanced Practice Midwife

## 2016-04-26 ENCOUNTER — Other Ambulatory Visit (HOSPITAL_COMMUNITY)
Admission: RE | Admit: 2016-04-26 | Discharge: 2016-04-26 | Disposition: A | Discharge status: Home / Self Care | Payer: Medicaid Other | Source: Ambulatory Visit | Attending: Advanced Practice Midwife | Admitting: Advanced Practice Midwife

## 2016-04-26 VITALS — BP 121/59 | HR 98 | Wt 157.0 lb

## 2016-04-26 DIAGNOSIS — O35BXX0 Maternal care for other (suspected) fetal abnormality and damage, fetal cardiac anomalies, not applicable or unspecified: Secondary | ICD-10-CM

## 2016-04-26 DIAGNOSIS — Z113 Encounter for screening for infections with a predominantly sexual mode of transmission: Secondary | ICD-10-CM | POA: Insufficient documentation

## 2016-04-26 DIAGNOSIS — O358XX0 Maternal care for other (suspected) fetal abnormality and damage, not applicable or unspecified: Secondary | ICD-10-CM | POA: Diagnosis present

## 2016-04-26 DIAGNOSIS — O0993 Supervision of high risk pregnancy, unspecified, third trimester: Secondary | ICD-10-CM

## 2016-04-26 LAB — POCT URINALYSIS DIP (DEVICE)
Bilirubin Urine: NEGATIVE
GLUCOSE, UA: 100 mg/dL — AB
Hgb urine dipstick: NEGATIVE
Ketones, ur: NEGATIVE mg/dL
NITRITE: NEGATIVE
PROTEIN: NEGATIVE mg/dL
Specific Gravity, Urine: 1.025 (ref 1.005–1.030)
UROBILINOGEN UA: 1 mg/dL (ref 0.0–1.0)
pH: 6.5 (ref 5.0–8.0)

## 2016-04-26 NOTE — Progress Notes (Signed)
   PRENATAL VISIT NOTE  Subjective:  Allison Shaw is a 20 y.o. G1P0 at 3424w0d being seen today for ongoing prenatal care.  She is currently monitored for the following issues for this low-risk pregnancy and has Supervision of high risk pregnancy, antepartum; Pregnancy complicated by fetal congenital heart disease; Bipolar 2 disorder (HCC); and Attention deficit hyperactivity disorder (ADHD) on her problem list.  Patient reports occasional contractions.  Contractions: Irregular.  .  Movement: Present. Denies leaking of fluid.   The following portions of the patient's history were reviewed and updated as appropriate: allergies, current medications, past family history, past medical history, past social history, past surgical history and problem list. Problem list updated.  Objective:   Vitals:   04/26/16 1107  BP: (!) 121/59  Pulse: 98  Weight: 157 lb (71.2 kg)    Fetal Status: Fetal Heart Rate (bpm): 149 Fundal Height: 39 cm Movement: Present  Presentation: Vertex  General:  Alert, oriented and cooperative. Patient is in no acute distress.  Skin: Skin is warm and dry. No rash noted.   Cardiovascular: Normal heart rate noted  Respiratory: Normal respiratory effort, no problems with respiration noted  Abdomen: Soft, gravid, appropriate for gestational age. Pain/Pressure: Absent     Pelvic:  Cervical exam performed Dilation: Fingertip Effacement (%): 70 Station: -3  Extremities: Normal range of motion.     Mental Status: Normal mood and affect. Normal behavior. Normal judgment and thought content.   Urinalysis:      Assessment and Plan:  Pregnancy: G1P0 at 2424w0d  1. Supervision of high risk pregnancy, antepartum, third trimester  - Culture, beta strep (group b only) - GC[facility]Chlamydia probe amp [facility]  2. Pregnancy complicated by fetal congenital heart disease, not applicable or unspecified fetus --Pt had Fetal echo on 8/25 per pt and her husband.  Right atrial  enlargement still present but enlarged c/w fetal growth only.  [facility] will need cardiac echo prior to discharge from hospital.   Preterm labor symptoms and general obstetric precautions including but not limited to vaginal bleeding, contractions, leaking of fluid and fetal movement were reviewed in detail with the patient. Please refer to After Visit Summary for other counseling recommendations.  Return in about 1 week (around 05/03/2016).  Hurshel PartyLisa A Leftwich-Kirby, CNM

## 2016-04-27 LAB — GC/CHLAMYDIA PROBE AMP (~~LOC~~) NOT AT ARMC
CHLAMYDIA, DNA PROBE: NEGATIVE
Neisseria Gonorrhea: NEGATIVE

## 2016-04-28 LAB — CULTURE, BETA STREP (GROUP B ONLY)

## 2016-05-03 ENCOUNTER — Encounter (HOSPITAL_COMMUNITY): Payer: Self-pay | Admitting: *Deleted

## 2016-05-03 ENCOUNTER — Inpatient Hospital Stay (HOSPITAL_COMMUNITY)
Admission: AD | Admit: 2016-05-03 | Discharge: 2016-05-03 | Disposition: A | Discharge status: Home / Self Care | Payer: Medicaid Other | Source: Ambulatory Visit | Attending: Obstetrics and Gynecology | Admitting: Obstetrics and Gynecology

## 2016-05-03 DIAGNOSIS — N949 Unspecified condition associated with female genital organs and menstrual cycle: Secondary | ICD-10-CM

## 2016-05-03 DIAGNOSIS — M545 Low back pain: Secondary | ICD-10-CM | POA: Insufficient documentation

## 2016-05-03 DIAGNOSIS — Z3A39 39 weeks gestation of pregnancy: Secondary | ICD-10-CM | POA: Diagnosis not present

## 2016-05-03 DIAGNOSIS — O358XX1 Maternal care for other (suspected) fetal abnormality and damage, fetus 1: Secondary | ICD-10-CM

## 2016-05-03 DIAGNOSIS — O9989 Other specified diseases and conditions complicating pregnancy, childbirth and the puerperium: Secondary | ICD-10-CM

## 2016-05-03 DIAGNOSIS — O0993 Supervision of high risk pregnancy, unspecified, third trimester: Secondary | ICD-10-CM

## 2016-05-03 DIAGNOSIS — O35BXX1 Maternal care for other (suspected) fetal abnormality and damage, fetal cardiac anomalies, fetus 1: Secondary | ICD-10-CM

## 2016-05-03 DIAGNOSIS — O26893 Other specified pregnancy related conditions, third trimester: Secondary | ICD-10-CM | POA: Diagnosis not present

## 2016-05-03 LAB — URINALYSIS, ROUTINE W REFLEX MICROSCOPIC
Bilirubin Urine: NEGATIVE
GLUCOSE, UA: NEGATIVE mg/dL
HGB URINE DIPSTICK: NEGATIVE
KETONES UR: NEGATIVE mg/dL
Nitrite: NEGATIVE
PROTEIN: NEGATIVE mg/dL
Specific Gravity, Urine: 1.015 (ref 1.005–1.030)
pH: 7 (ref 5.0–8.0)

## 2016-05-03 LAB — URINE MICROSCOPIC-ADD ON

## 2016-05-03 NOTE — Discharge Instructions (Signed)
Braxton Hicks Contractions °Contractions of the uterus can occur throughout pregnancy. Contractions are not always a sign that you are in labor.  °WHAT ARE BRAXTON HICKS CONTRACTIONS?  °Contractions that occur before labor are called Braxton Hicks contractions, or false labor. Toward the end of pregnancy (32-34 weeks), these contractions can develop more often and may become more forceful. This is not true labor because these contractions do not result in opening (dilatation) and thinning of the cervix. They are sometimes difficult to tell apart from true labor because these contractions can be forceful and people have different pain tolerances. You should not feel embarrassed if you go to the hospital with false labor. Sometimes, the only way to tell if you are in true labor is for your health care provider to look for changes in the cervix. °If there are no prenatal problems or other health problems associated with the pregnancy, it is completely safe to be sent home with false labor and await the onset of true labor. °HOW CAN YOU TELL THE DIFFERENCE BETWEEN TRUE AND FALSE LABOR? °False Labor °· The contractions of false labor are usually shorter and not as hard as those of true labor.   °· The contractions are usually irregular.   °· The contractions are often felt in the front of the lower abdomen and in the groin.   °· The contractions may go away when you walk around or change positions while lying down.   °· The contractions get weaker and are shorter lasting as time goes on.   °· The contractions do not usually become progressively stronger, regular, and closer together as with true labor.   °True Labor °· Contractions in true labor last 30-70 seconds, become very regular, usually become more intense, and increase in frequency.   °· The contractions do not go away with walking.   °· The discomfort is usually felt in the top of the uterus and spreads to the lower abdomen and low back.   °· True labor can be  determined by your health care provider with an exam. This will show that the cervix is dilating and getting thinner.   °WHAT TO REMEMBER °· Keep up with your usual exercises and follow other instructions given by your health care provider.   °· Take medicines as directed by your health care provider.   °· Keep your regular prenatal appointments.   °· Eat and drink lightly if you think you are going into labor.   °· If Braxton Hicks contractions are making you uncomfortable:   °¨ Change your position from lying down or resting to walking, or from walking to resting.   °¨ Sit and rest in a tub of warm water.   °¨ Drink 2-3 glasses of water. Dehydration may cause these contractions.   °¨ Do slow and deep breathing several times an hour.   °WHEN SHOULD I SEEK IMMEDIATE MEDICAL CARE? °Seek immediate medical care if: °· Your contractions become stronger, more regular, and closer together.   °· You have fluid leaking or gushing from your vagina.   °· You have a fever.   °· You pass blood-tinged mucus.   °· You have vaginal bleeding.   °· You have continuous abdominal pain.   °· You have low back pain that you never had before.   °· You feel your baby's head pushing down and causing pelvic pressure.   °· Your baby is not moving as much as it used to.   °  °This information is not intended to replace advice given to you by your health care provider. Make sure you discuss any questions you have with your health care   provider. °  °Document Released: 08/01/2005 Document Revised: 08/06/2013 Document Reviewed: 05/13/2013 °Elsevier Interactive Patient Education ©2016 Elsevier Inc. ° °

## 2016-05-03 NOTE — MAU Note (Signed)
C/o back pain all night and today;

## 2016-05-03 NOTE — MAU Provider Note (Signed)
History     CSN: [id number]  Arrival date and time: 05/03/16 1435    Chief Complaint  Patient presents with  . Back Pain   Allison Shaw is a 20 y.o. G1P0 at 6841w0d who presents because father of baby thought could feel baby's head during sexual intercourse. Was concerned and wanted reassurance. Also having low back pain and has tried tylenol, heating pads, massage without relief. States lower back pain in associated with some occasional b/l lower abdominal pain; both usually occurs with movement or upon first standing. Denies LOF, vaginal bleeding. Endorses good fetal movement.     OB History    Gravida Para Term Preterm AB Living   1             SAB TAB Ectopic Multiple Live Births                  Past Medical History:  Diagnosis Date  . ADHD (attention deficit hyperactivity disorder)   . Bipolar 1 disorder (HCC)   . Depression   . Mental disorder     Past Surgical History:  Procedure Laterality Date  . 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 Mental illness Mother   . Depression Mother     Social History  Substance Use Topics  . Smoking status: Never Smoker  . Smokeless tobacco: Never Used  . Alcohol use No    Allergies:  Allergies  Allergen Reactions  . Latex Itching and Other (See Comments)    Causes burning.  . Fish Allergy Rash    Prescriptions Prior to Admission  Medication Sig Dispense Refill Last Dose  . acetaminophen (TYLENOL) 325 MG tablet Take 325 mg by mouth every 6 (six) hours as needed for mild pain or headache.   05/02/2016 at Unknown time  . calcium carbonate (TUMS - DOSED IN MG ELEMENTAL CALCIUM) 500 MG chewable tablet Chew 1 tablet by mouth 2 (two) times daily as needed for indigestion or heartburn.   Past Week at Unknown time  . famotidine (PEPCID) 40 MG tablet Take 1 tablet (40 mg total) by mouth daily. 30 tablet 1 05/02/2016 at Unknown time  . Prenatal Vit-Fe Fumarate-FA  (PRENATAL MULTIVITAMIN) TABS tablet Take 1 tablet by mouth at bedtime.   05/02/2016 at Unknown time    Review of Systems  Constitutional: Negative for chills and fever.  Eyes: Negative for blurred vision, double vision and photophobia.  Respiratory: Negative for shortness of breath.   Cardiovascular: Negative for chest pain.  Gastrointestinal: Negative for abdominal pain, nausea and vomiting.  Genitourinary: Negative for dysuria, frequency and urgency.  Musculoskeletal: Positive for back pain (b/l lower).  Neurological: Negative for headaches.   Physical Exam   Blood pressure 116/66, pulse 97, temperature 97.9 F (36.6 C), temperature source Oral, resp. rate 16, last menstrual period 07/26/2015.  Physical Exam  Constitutional: She is oriented to person, place, and time. She appears well-developed and well-nourished. No distress.  HENT:  Head: Normocephalic and atraumatic.  Cardiovascular: Normal rate, regular rhythm and normal heart sounds.   No murmur heard. Respiratory: Effort normal and breath sounds normal. No respiratory distress. She has no wheezes.  GI: Soft. Bowel sounds are normal. There is no tenderness. There is no rebound and no guarding.  Musculoskeletal: Normal range of motion. She exhibits no tenderness (no TTP over b/l lumbar region of back).  Neurological: She is alert and oriented to  person, place, and time.  Skin: Skin is warm and dry.  Psychiatric: She has a normal mood and affect.    MAU Course  Procedures  MDM NST reactive Cervical exam by RN: fingertip/thick/-3  Assessment and Plan  IUP@39 .0 - given return precautions and labor precautions  Leland Her PGY-1 05/03/2016, 3:45 PM   OB FELLOW MAU DISCHARGE ATTESTATION  I have seen and examined this patient; I agree with above documentation in the resident's note.   I personally reviewed the patient's NST today, found to be REACTIVE. 130 bpm, mod var, +accels, no decels. CTX: Rare.   Jen Mow, DO OB Fellow 5:29 PM

## 2016-05-04 ENCOUNTER — Ambulatory Visit (INDEPENDENT_AMBULATORY_CARE_PROVIDER_SITE_OTHER): Payer: Medicaid Other | Admitting: Advanced Practice Midwife

## 2016-05-04 ENCOUNTER — Encounter: Payer: Self-pay | Admitting: Advanced Practice Midwife

## 2016-05-04 VITALS — BP 128/73 | HR 116 | Wt 158.0 lb

## 2016-05-04 DIAGNOSIS — O0993 Supervision of high risk pregnancy, unspecified, third trimester: Secondary | ICD-10-CM

## 2016-05-04 LAB — POCT URINALYSIS DIP (DEVICE)
Bilirubin Urine: NEGATIVE
GLUCOSE, UA: NEGATIVE mg/dL
Hgb urine dipstick: NEGATIVE
KETONES UR: NEGATIVE mg/dL
NITRITE: NEGATIVE
PROTEIN: NEGATIVE mg/dL
Specific Gravity, Urine: 1.02 (ref 1.005–1.030)
Urobilinogen, UA: 0.2 mg/dL (ref 0.0–1.0)
pH: 6.5 (ref 5.0–8.0)

## 2016-05-04 NOTE — Patient Instructions (Signed)
Vaginal Delivery °During delivery, your health care provider will help you give birth to your baby. During a vaginal delivery, you will work to push the baby out of your vagina. However, before you can push your baby out, a few things need to happen. The opening of your uterus (cervix) has to soften, thin out, and open up (dilate) all the way to 10 cm. Also, your baby has to move down from the uterus into your vagina.  °SIGNS OF LABOR  °Your health care provider will first need to make sure you are in labor. Signs of labor include:  °· Passing what is called the mucous plug before labor begins. This is a small amount of blood-stained mucus. °· Having regular, painful uterine contractions.   °· The time between contractions gets shorter.   °· The discomfort and pain gradually get more intense. °· Contraction pains get worse when walking and do not go away when resting.   °· Your cervix becomes thinner (effacement) and dilates. °BEFORE THE DELIVERY °Once you are in labor and admitted into the hospital or care center, your health care provider may do the following:  °· Perform a complete physical exam. °· Review any complications related to pregnancy or labor.  °· Check your blood pressure, pulse, temperature, and heart rate (vital signs).   °· Determine if, and when, the rupture of amniotic membranes occurred. °· Do a vaginal exam (using a sterile glove and lubricant) to determine:   °¨ The position (presentation) of the baby. Is the baby's head presenting first (vertex) in the birth canal (vagina), or are the feet or buttocks first (breech)?   °¨ The level (station) of the baby's head within the birth canal.   °¨ The effacement and dilatation of the cervix.   °· An electronic fetal monitor is usually placed on your abdomen when you first arrive. This is used to monitor your contractions and the baby's heart rate. °¨ When the monitor is on your abdomen (external fetal monitor), it can only pick up the frequency and  length of your contractions. It cannot tell the strength of your contractions. °¨ If it becomes necessary for your health care provider to know exactly how strong your contractions are or to see exactly what the baby's heart rate is doing, an internal monitor may be inserted into your vagina and uterus. Your health care provider will discuss the benefits and risks of using an internal monitor and obtain your permission before inserting the device. °¨ Continuous fetal monitoring may be needed if you have an epidural, are receiving certain medicines (such as oxytocin), or have pregnancy or labor complications. °· An IV access tube may be placed into a vein in your arm to deliver fluids and medicines if necessary. °THREE STAGES OF LABOR AND DELIVERY °Normal labor and delivery is divided into three stages. °First Stage °This stage starts when you begin to contract regularly and your cervix begins to efface and dilate. It ends when your cervix is completely open (fully dilated). The first stage is the longest stage of labor and can last from 3 hours to 15 hours.  °Several methods are available to help with labor pain. You and your health care provider will decide which option is best for you. Options include:  °· Opioid medicines. These are strong pain medicines that you can get through your IV tube or as a shot into your muscle. These medicines lessen pain but do not make it go away completely.  °· Epidural. A medicine is given through a thin tube that   is inserted in your back. The medicine numbs the lower part of your body and prevents any pain in that area. °· Paracervical pain medicine. This is an injection of an anesthetic on each side of your cervix.   °· You may request natural childbirth, which does not involve the use of pain medicines or an epidural during labor and delivery. Instead, you will use other things, such as breathing exercises, to help cope with the pain. °Second Stage °The second stage of labor  begins when your cervix is fully dilated at 10 cm. It continues until you push your baby down through the birth canal and the baby is born. This stage can take only minutes or several hours. °· The location of your baby's head as it moves through the birth canal is reported as a number called a station. If the baby's head has not started its descent, the station is described as being at minus 3 (-3). When your baby's head is at the zero station, it is at the middle of the birth canal and is engaged in the pelvis. The station of your baby helps indicate the progress of the second stage of labor. °· When your baby is born, your health care provider may hold the baby with his or her head lowered to prevent amniotic fluid, mucus, and blood from getting into the baby's lungs. The baby's mouth and nose may be suctioned with a small bulb syringe to remove any additional fluid. °· Your health care provider may then place the baby on your stomach. It is important to keep the baby from getting cold. To do this, the health care provider will dry the baby off, place the baby directly on your skin (with no blankets between you and the baby), and cover the baby with warm, dry blankets.   °· The umbilical cord is cut. °Third Stage °During the third stage of labor, your health care provider will deliver the placenta (afterbirth) and make sure your bleeding is under control. The delivery of the placenta usually takes about 5 minutes but can take up to 30 minutes. After the placenta is delivered, a medicine may be given either by IV or injection to help contract the uterus and control bleeding. If you are planning to breastfeed, you can try to do so now. °After you deliver the placenta, your uterus should contract and get very firm. If your uterus does not remain firm, your health care provider will massage it. This is important because the contraction of the uterus helps cut off bleeding at the site where the placenta was attached  to your uterus. If your uterus does not contract properly and stay firm, you may continue to bleed heavily. If there is a lot of bleeding, medicines may be given to contract the uterus and stop the bleeding.  °  °This information is not intended to replace advice given to you by your health care provider. Make sure you discuss any questions you have with your health care provider. °  °Document Released: 05/10/2008 Document Revised: 08/22/2014 Document Reviewed: 03/28/2012 °Elsevier Interactive Patient Education ©2016 [employer] ° °

## 2016-05-04 NOTE — Progress Notes (Signed)
   PRENATAL VISIT NOTE  Subjective:  Allison Shaw is a 20 y.o. G1P0 at 845w1d being seen today for ongoing prenatal care.  She is currently monitored for the following issues for this high-risk pregnancy and has Supervision of high risk pregnancy, antepartum; Pregnancy complicated by fetal congenital heart disease; Bipolar 2 disorder (HCC); and Attention deficit hyperactivity disorder (ADHD) on her problem list.  Patient reports occasional contractions.  Contractions: Irregular. Vag. Bleeding: None.  Movement: Present. Denies leaking of fluid.   The following portions of the patient's history were reviewed and updated as appropriate: allergies, current medications, past family history, past medical history, past social history, past surgical history and problem list. Problem list updated.  Objective:   Vitals:   05/04/16 1507  BP: 128/73  Pulse: (!) 116  Weight: 158 lb (71.7 kg)    Fetal Status: Fetal Heart Rate (bpm): 142   Movement: Present     General:  Alert, oriented and cooperative. Patient is in no acute distress.  Skin: Skin is warm and dry. No rash noted.   Cardiovascular: Normal heart rate noted  Respiratory: Normal respiratory effort, no problems with respiration noted  Abdomen: Soft, gravid, appropriate for gestational age. Pain/Pressure: Absent     Pelvic:  Cervical exam performed        Cervix tight 1cm/ 60%/-3/posterior, attempted sweeping  Extremities: Normal range of motion.  Edema: None  Mental Status: Normal mood and affect. Normal behavior. Normal judgment and thought content.   Urinalysis:      Assessment and Plan:  Pregnancy: G1P0 at 5745w1d  Patient requesting elective IOL because [name]'s grandfather is having surgery and they want him to see baby "just in case".  Discussed reasons we dont electively induce, risk of C/S and complications.  Attempted sweeping per request. May need to try again next week.   Term labor symptoms and general obstetric precautions  including but not limited to vaginal bleeding, contractions, leaking of fluid and fetal movement were reviewed in detail with the patient. Please refer to After Visit Summary for other counseling recommendations.  Return in about 1 week (around 05/11/2016) for [facility].  Aviva SignsMarie L Emma-Lee Oddo, CNM

## 2016-05-07 ENCOUNTER — Inpatient Hospital Stay (HOSPITAL_COMMUNITY)
Admission: AD | Admit: 2016-05-07 | Discharge: 2016-05-07 | Disposition: A | Discharge status: Home / Self Care | Payer: Medicaid Other | Source: Ambulatory Visit | Attending: Obstetrics and Gynecology | Admitting: Obstetrics and Gynecology

## 2016-05-07 ENCOUNTER — Encounter (HOSPITAL_COMMUNITY): Payer: Self-pay | Admitting: Certified Nurse Midwife

## 2016-05-07 DIAGNOSIS — Z3493 Encounter for supervision of normal pregnancy, unspecified, third trimester: Secondary | ICD-10-CM | POA: Insufficient documentation

## 2016-05-07 LAB — OB RESULTS CONSOLE GBS: STREP GROUP B AG: NEGATIVE

## 2016-05-07 MED ORDER — CYCLOBENZAPRINE HCL 5 MG PO TABS
5.0000 mg | ORAL_TABLET | Freq: Once | ORAL | Status: AC
  Administered 2016-05-07: 5 mg via ORAL
  Filled 2016-05-07: qty 1

## 2016-05-07 MED ORDER — PROMETHAZINE HCL 25 MG PO TABS
25.0000 mg | ORAL_TABLET | Freq: Four times a day (QID) | ORAL | Status: DC | PRN
  Administered 2016-05-07: 25 mg via ORAL
  Filled 2016-05-07: qty 1

## 2016-05-07 NOTE — Discharge Instructions (Signed)

## 2016-05-07 NOTE — MAU Note (Signed)
Pt states she has ctxs every 4-5 mins since yesterday. Pt states eating makes her feel nauseous. Pt denies LOF or vaginal bleeding. Fetus is active.

## 2016-05-12 ENCOUNTER — Ambulatory Visit (INDEPENDENT_AMBULATORY_CARE_PROVIDER_SITE_OTHER): Payer: Medicaid Other | Admitting: Obstetrics & Gynecology

## 2016-05-12 ENCOUNTER — Telehealth (HOSPITAL_COMMUNITY): Payer: Self-pay | Admitting: *Deleted

## 2016-05-12 VITALS — BP 124/63 | HR 101 | Wt 157.7 lb

## 2016-05-12 DIAGNOSIS — O0993 Supervision of high risk pregnancy, unspecified, third trimester: Secondary | ICD-10-CM

## 2016-05-12 DIAGNOSIS — Z36 Encounter for antenatal screening of mother: Secondary | ICD-10-CM | POA: Diagnosis not present

## 2016-05-12 DIAGNOSIS — O480 Post-term pregnancy: Secondary | ICD-10-CM

## 2016-05-12 DIAGNOSIS — O358XX0 Maternal care for other (suspected) fetal abnormality and damage, not applicable or unspecified: Secondary | ICD-10-CM | POA: Diagnosis not present

## 2016-05-12 NOTE — Progress Notes (Signed)
Pt reports watery fluid leaking today during and after her bath.

## 2016-05-12 NOTE — Telephone Encounter (Signed)
Preadmission screen  

## 2016-05-12 NOTE — Progress Notes (Signed)
   PRENATAL VISIT NOTE  Subjective:  Allison Shaw is a 20 y.o. G1P0 at 2299w2d being seen today for ongoing prenatal care.  She is currently monitored for the following issues for this low-risk pregnancy and has Supervision of high risk pregnancy, antepartum; Pregnancy complicated by fetal congenital heart disease; Bipolar 2 disorder (HCC); and Attention deficit hyperactivity disorder (ADHD) on her problem list.  Patient reports no complaints.  Contractions: Regular. Vag. Bleeding: None.  Movement: (!) Decreased. Denies leaking of fluid.   The following portions of the patient's history were reviewed and updated as appropriate: allergies, current medications, past family history, past medical history, past social history, past surgical history and problem list. Problem list updated.  Objective:discharge noted   Vitals:   05/12/16 1402  BP: 124/63  Pulse: (!) 101  Weight: 157 lb 11.2 oz (71.5 kg)    Fetal Status: Fetal Heart Rate (bpm): NST   Movement: (!) Decreased     General:  Alert, oriented and cooperative. Patient is in no acute distress.  Skin: Skin is warm and dry. No rash noted.   Cardiovascular: Normal heart rate noted  Respiratory: Normal respiratory effort, no problems with respiration noted  Abdomen: Soft, gravid, appropriate for gestational age. Pain/Pressure: Present     Pelvic:  Cervical exam performed        Extremities: Normal range of motion.  Edema: Trace  Mental Status: Normal mood and affect. Normal behavior. Normal judgment and thought content.   Urinalysis:      Assessment and Plan:  Pregnancy: G1P0 at 5599w2d  1. Post term pregnancy, antepartum condition or complication No oligo, NST reactive today - Amniotic fluid index with NST  2. Supervision of high risk pregnancy, antepartum, third trimester Schedule IOL 41 weeks  Term labor symptoms and general obstetric precautions including but not limited to vaginal bleeding, contractions, leaking of fluid and  fetal movement were reviewed in detail with the patient. Please refer to After Visit Summary for other counseling recommendations.  Return if symptoms worsen or fail to improve, for 6 week pp.  Adam PhenixJames G Nichole Keltner, MD

## 2016-05-16 ENCOUNTER — Ambulatory Visit (INDEPENDENT_AMBULATORY_CARE_PROVIDER_SITE_OTHER): Payer: Medicaid Other | Admitting: *Deleted

## 2016-05-16 ENCOUNTER — Inpatient Hospital Stay (HOSPITAL_COMMUNITY): Payer: Medicaid Other

## 2016-05-16 VITALS — BP 111/69 | HR 101

## 2016-05-16 DIAGNOSIS — O480 Post-term pregnancy: Secondary | ICD-10-CM

## 2016-05-16 DIAGNOSIS — O471 False labor at or after 37 completed weeks of gestation: Secondary | ICD-10-CM | POA: Diagnosis not present

## 2016-05-16 DIAGNOSIS — Z3A41 41 weeks gestation of pregnancy: Secondary | ICD-10-CM | POA: Diagnosis not present

## 2016-05-16 NOTE — Progress Notes (Signed)
NST Note Date: 05/16/2016 Gestational Age: 20/6 FHT: 135 baseline, +accels, no decel, mod var Toco: occasional UCs  A/P: rNST. Continue with current plan of care.   Allison Shaw, Jr MD Attending [facility] Mid[facility](Faculty Practice)

## 2016-05-18 ENCOUNTER — Inpatient Hospital Stay (HOSPITAL_COMMUNITY)
Admission: RE | Admit: 2016-05-18 | Discharge: 2016-05-21 | DRG: 775 | Disposition: A | Discharge status: Home / Self Care | Payer: Medicaid Other | Source: Ambulatory Visit | Attending: Family Medicine | Admitting: Family Medicine

## 2016-05-18 ENCOUNTER — Inpatient Hospital Stay (HOSPITAL_COMMUNITY): Payer: Medicaid Other | Admitting: Anesthesiology

## 2016-05-18 ENCOUNTER — Encounter (HOSPITAL_COMMUNITY): Payer: Self-pay

## 2016-05-18 DIAGNOSIS — F909 Attention-deficit hyperactivity disorder, unspecified type: Secondary | ICD-10-CM | POA: Diagnosis present

## 2016-05-18 DIAGNOSIS — O99344 Other mental disorders complicating childbirth: Secondary | ICD-10-CM | POA: Diagnosis present

## 2016-05-18 DIAGNOSIS — R339 Retention of urine, unspecified: Secondary | ICD-10-CM | POA: Diagnosis present

## 2016-05-18 DIAGNOSIS — D649 Anemia, unspecified: Secondary | ICD-10-CM | POA: Diagnosis not present

## 2016-05-18 DIAGNOSIS — Z3A41 41 weeks gestation of pregnancy: Secondary | ICD-10-CM

## 2016-05-18 DIAGNOSIS — O9081 Anemia of the puerperium: Secondary | ICD-10-CM | POA: Diagnosis not present

## 2016-05-18 DIAGNOSIS — F3181 Bipolar II disorder: Secondary | ICD-10-CM | POA: Diagnosis present

## 2016-05-18 DIAGNOSIS — O480 Post-term pregnancy: Secondary | ICD-10-CM | POA: Diagnosis present

## 2016-05-18 LAB — CBC
HEMATOCRIT: 35.8 % — AB (ref 36.0–46.0)
HEMOGLOBIN: 11.8 g/dL — AB (ref 12.0–15.0)
MCH: 27.9 pg (ref 26.0–34.0)
MCHC: 33 g/dL (ref 30.0–36.0)
MCV: 84.6 fL (ref 78.0–100.0)
Platelets: 183 10*3/uL (ref 150–400)
RBC: 4.23 MIL/uL (ref 3.87–5.11)
RDW: 15.6 % — ABNORMAL HIGH (ref 11.5–15.5)
WBC: 11 10*3/uL — AB (ref 4.0–10.5)

## 2016-05-18 LAB — TYPE AND SCREEN
ABO/RH(D): A POS
ANTIBODY SCREEN: NEGATIVE

## 2016-05-18 LAB — ABO/RH: ABO/RH(D): A POS

## 2016-05-18 MED ORDER — MISOPROSTOL 25 MCG QUARTER TABLET
25.0000 ug | ORAL_TABLET | ORAL | Status: DC
  Administered 2016-05-18: 25 ug via VAGINAL
  Filled 2016-05-18 (×2): qty 1
  Filled 2016-05-18: qty 0.25
  Filled 2016-05-18 (×3): qty 1

## 2016-05-18 MED ORDER — DIPHENHYDRAMINE HCL 50 MG/ML IJ SOLN
12.5000 mg | INTRAMUSCULAR | Status: DC | PRN
  Administered 2016-05-19: 12.5 mg via INTRAVENOUS
  Filled 2016-05-18: qty 1

## 2016-05-18 MED ORDER — TERBUTALINE SULFATE 1 MG/ML IJ SOLN
0.2500 mg | Freq: Once | INTRAMUSCULAR | Status: DC | PRN
  Filled 2016-05-18: qty 1

## 2016-05-18 MED ORDER — DIPHENHYDRAMINE HCL 50 MG/ML IJ SOLN: 12.5000 mg | INTRAMUSCULAR | Status: DC | PRN

## 2016-05-18 MED ORDER — OXYCODONE-ACETAMINOPHEN 5-325 MG PO TABS: 2.0000 | ORAL_TABLET | ORAL | Status: DC | PRN

## 2016-05-18 MED ORDER — SOD CITRATE-CITRIC ACID 500-334 MG/5ML PO SOLN
30.0000 mL | ORAL | Status: DC | PRN
  Filled 2016-05-18: qty 15

## 2016-05-18 MED ORDER — OXYTOCIN BOLUS FROM INFUSION
500.0000 mL | Freq: Once | INTRAVENOUS | Status: AC
  Administered 2016-05-19: 500 mL/h via INTRAVENOUS

## 2016-05-18 MED ORDER — EPHEDRINE 5 MG/ML INJ: 10.0000 mg | INTRAVENOUS | Status: DC | PRN

## 2016-05-18 MED ORDER — LIDOCAINE HCL (PF) 1 % IJ SOLN
30.0000 mL | INTRAMUSCULAR | Status: DC | PRN
Start: 2016-05-18 — End: 2016-05-19
  Filled 2016-05-18: qty 30

## 2016-05-18 MED ORDER — OXYCODONE-ACETAMINOPHEN 5-325 MG PO TABS
1.0000 | ORAL_TABLET | ORAL | Status: DC | PRN
  Administered 2016-05-19: 1 via ORAL
  Filled 2016-05-18: qty 1

## 2016-05-18 MED ORDER — EPHEDRINE 5 MG/ML INJ
10.0000 mg | INTRAVENOUS | Status: DC | PRN
  Filled 2016-05-18: qty 4

## 2016-05-18 MED ORDER — ONDANSETRON HCL 4 MG/2ML IJ SOLN
4.0000 mg | Freq: Four times a day (QID) | INTRAMUSCULAR | Status: DC | PRN
  Administered 2016-05-19: 4 mg via INTRAVENOUS
  Filled 2016-05-18: qty 2

## 2016-05-18 MED ORDER — LIDOCAINE HCL (PF) 1 % IJ SOLN
INTRAMUSCULAR | Status: DC | PRN
  Administered 2016-05-18 (×2): 5 mL

## 2016-05-18 MED ORDER — FLEET ENEMA 7-19 GM/118ML RE ENEM: 1.0000 | ENEMA | RECTAL | Status: DC | PRN

## 2016-05-18 MED ORDER — ACETAMINOPHEN 325 MG PO TABS
650.0000 mg | ORAL_TABLET | ORAL | Status: DC | PRN
  Filled 2016-05-18: qty 2

## 2016-05-18 MED ORDER — PHENYLEPHRINE 40 MCG/ML (10ML) SYRINGE FOR IV PUSH (FOR BLOOD PRESSURE SUPPORT): 80.0000 ug | PREFILLED_SYRINGE | INTRAVENOUS | Status: DC | PRN

## 2016-05-18 MED ORDER — LACTATED RINGERS IV SOLN
INTRAVENOUS | Status: DC
  Administered 2016-05-18 – 2016-05-19 (×3): via INTRAVENOUS

## 2016-05-18 MED ORDER — FENTANYL CITRATE (PF) 100 MCG/2ML IJ SOLN
100.0000 ug | INTRAMUSCULAR | Status: DC | PRN
  Administered 2016-05-18 (×2): 100 ug via INTRAVENOUS
  Filled 2016-05-18 (×2): qty 2

## 2016-05-18 MED ORDER — FENTANYL 2.5 MCG/ML BUPIVACAINE 1/10 % EPIDURAL INFUSION (WH - ANES)
14.0000 mL/h | INTRAMUSCULAR | Status: DC | PRN
  Administered 2016-05-18 – 2016-05-19 (×3): 14 mL/h via EPIDURAL
  Filled 2016-05-18 (×3): qty 125

## 2016-05-18 MED ORDER — PHENYLEPHRINE 40 MCG/ML (10ML) SYRINGE FOR IV PUSH (FOR BLOOD PRESSURE SUPPORT)
80.0000 ug | PREFILLED_SYRINGE | INTRAVENOUS | Status: DC | PRN
  Filled 2016-05-18: qty 5
  Filled 2016-05-18: qty 10

## 2016-05-18 MED ORDER — FENTANYL 2.5 MCG/ML BUPIVACAINE 1/10 % EPIDURAL INFUSION (WH - ANES): 14.0000 mL/h | INTRAMUSCULAR | Status: DC | PRN

## 2016-05-18 MED ORDER — LACTATED RINGERS IV SOLN
500.0000 mL | INTRAVENOUS | Status: DC | PRN
  Administered 2016-05-19: 500 mL via INTRAVENOUS

## 2016-05-18 MED ORDER — LACTATED RINGERS IV SOLN: 500.0000 mL | Freq: Once | INTRAVENOUS | Status: DC

## 2016-05-18 MED ORDER — FAMOTIDINE 20 MG PO TABS
20.0000 mg | ORAL_TABLET | Freq: Two times a day (BID) | ORAL | Status: DC
  Administered 2016-05-18: 20 mg via ORAL
  Filled 2016-05-18 (×2): qty 1

## 2016-05-18 MED ORDER — PHENYLEPHRINE 40 MCG/ML (10ML) SYRINGE FOR IV PUSH (FOR BLOOD PRESSURE SUPPORT)
80.0000 ug | PREFILLED_SYRINGE | INTRAVENOUS | Status: DC | PRN
  Filled 2016-05-18: qty 5

## 2016-05-18 MED ORDER — OXYTOCIN 40 UNITS IN LACTATED RINGERS INFUSION - SIMPLE MED: 2.5000 [IU]/h | INTRAVENOUS | Status: DC

## 2016-05-18 MED ORDER — OXYTOCIN 40 UNITS IN LACTATED RINGERS INFUSION - SIMPLE MED
1.0000 m[IU]/min | INTRAVENOUS | Status: DC
  Administered 2016-05-18: 2 m[IU]/min via INTRAVENOUS
  Filled 2016-05-18: qty 1000

## 2016-05-18 NOTE — H&P (Signed)
LABOR AND DELIVERY ADMISSION HISTORY AND PHYSICAL NOTE  Allison Shaw is a 20 y.o. female G1P0 with IUP at 3263w1d by 12 wk u/s presenting for pdiol.   She reports positive fetal movement. She denies leakage of fluid or vaginal bleeding.  Prenatal History/Complications:  Past Medical History: Past Medical History:  Diagnosis Date  . ADHD (attention deficit hyperactivity disorder)   . Bipolar 1 disorder (HCC)   . Depression   . Mental disorder     Past Surgical History: Past Surgical History:  Procedure Laterality Date  . 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 Marital status: Single    Spouse name: N/A  . Number of children: N/A  . Years of education: N/A   Social History Main Topics  . Smoking status: Never Smoker  . Smokeless tobacco: Never Used  . Alcohol use No  . Drug use: No  . Sexual activity: Yes   Other Topics Concern  . None   Social History Narrative  . None    Family History: Family History  Problem Relation Age of Onset  . Mental illness Mother   . Depression Mother     Allergies: Allergies  Allergen Reactions  . Latex Itching and Other (See Comments)    Reaction:  Burning   . Fish Allergy Rash    Prescriptions Prior to Admission  Medication Sig Dispense Refill Last Dose  . acetaminophen (TYLENOL) 325 MG tablet Take 325 mg by mouth every 6 (six) hours as needed for mild pain, moderate pain or headache.    Past Month at Unknown time  . calcium carbonate (TUMS - DOSED IN MG ELEMENTAL CALCIUM) 500 MG chewable tablet Chew 1 tablet by mouth as needed for indigestion or heartburn.    05/17/2016 at Unknown time  . famotidine (PEPCID) 40 MG tablet Take 1 tablet (40 mg total) by mouth daily. 30 tablet 1 05/17/2016 at Unknown time  .  Prenatal Vit-Fe Fumarate-FA (PRENATAL MULTIVITAMIN) TABS tablet Take 1 tablet by mouth at bedtime.   05/17/2016 at Unknown time     Review of Systems   All systems reviewed and negative except as stated in HPI  Height 5\' 3"  (1.6 m), weight 158 lb (71.7 kg), last menstrual period 07/26/2015. General appearance: alert, cooperative and appears stated age Lungs: clear to auscultation bilaterally Heart: regular rate and rhythm Abdomen: soft, non-tender; bowel sounds normal Extremities: No calf swelling or tenderness Presentation: cephalic Fetal monitoring: 140/mod/+a/-d Uterine activity: quiet Dilation: 1.5 Effacement (%): 70 Exam by:: Ron Beske   Prenatal labs: ABO, Rh: A/Positive/-- (03/15 0000) Antibody: Negative (03/15 0000) Rubella: !Error!immune RPR: Nonreactive (07/06 0000)  HBsAg: Negative (03/15 0000)  HIV: Non-reactive (07/06 0000)  GBS: Negative (09/23 1005)  1 hr Glucola: normal per patient Genetic screening:  First wnl Anatomy US: wnl save for right atrial enlargement  Prenatal Transfer Tool  Maternal Diabetes: No Genetic Screening: Normal Maternal Ultrasounds/Referrals: Abnormal:  Findings:   Cardiac defect (right atrial enlargement) Fetal Ultrasounds or other Referrals:  Fetal echo Maternal Substance Abuse:  No Significant Maternal Medications:  None Significant Maternal Lab Results: Lab values include: Group  B Strep negative  Results for orders placed or performed during the hospital encounter of 05/18/16 (from the past 24 hour(s))  CBC   Collection Time: 05/18/16 10:10 AM  Result Value Ref Range   WBC 11.0 (H) 4.0 - 10.5 K/uL   RBC 4.23 3.87 - 5.11 MIL/uL   Hemoglobin 11.8 (L) 12.0 - 15.0 g/dL   HCT 16.1 (L) 09.6 - 04.5 %   MCV 84.6 78.0 - 100.0 fL   MCH 27.9 26.0 - 34.0 pg   MCHC 33.0 30.0 - 36.0 g/dL   RDW 40.9 (H) 81.1 - 91.4 %   Platelets 183 150 - 400 K/uL    Patient Active Problem List   Diagnosis Date Noted  . Post-dates pregnancy 05/18/2016   . Bipolar 2 disorder (HCC) 04/20/2016  . Attention deficit hyperactivity disorder (ADHD) 04/20/2016  . Supervision of high risk pregnancy, antepartum 03/24/2016  . Pregnancy complicated by fetal congenital heart disease 03/24/2016    Assessment: Allison Shaw is a 20 y.o. G1P0 at [redacted]w[redacted]d here for pdiol  #Labor: foley placed, cytotec ordered #Pain: Non-pharm for now #FWB: Cat 1 #ID:  gbs neg #MOF: breast #MOC: depo #Circ:  N/a #Fetal right atrial enlargement: stable on echo. Peds cardiology recommending infant echo prior to d/c. Peds made aware via prenatal transfer tool. Fetal heart monitoring in labor. #bipolar: stable, not on meds.  Anette Riedel B Lylian Sanagustin 05/18/2016, 12:00 PM

## 2016-05-18 NOTE — Progress Notes (Signed)
Patient ID: Allison Shaw, female   DOB: Dec 17, 1995, 20 y.o.   MRN: [id number] LABOR PROGRESS NOTE  Allison Shaw is a 20 y.o. G1P0 at 5927w1d  admitted for PDIOL.   Subjective: Reports her pain is much improved since getting her epidural. Overall she is doing well.  Objective: BP 112/67   Pulse 95   Resp 16   Ht 5\' 3"  (1.6 m)   Wt 71.7 kg (158 lb)   LMP 07/26/2015 (Exact Date)   SpO2 100%   BMI 27.99 kg/m  or  Vitals:   05/18/16 2035 05/18/16 2041 05/18/16 2045 05/18/16 2050  BP: 114/71 116/70 113/69 112/67  Pulse: 96 92 95 95  Resp: 18 16 16 16   SpO2: 100% 100% 100% 100%  Weight:      Height:        Dilation: 4.5 Effacement (%): 70 Station: -2 Presentation: Vertex Exam by:: Dr. Wonda Oldsiccio FHT: 135 baseline, moderate variability, accelerations present, no decelerations  Uterine activity: every 2-3 minutes on the monitor   Labs: Lab Results  Component Value Date   WBC 11.0 (H) 05/18/2016   HGB 11.8 (L) 05/18/2016   HCT 35.8 (L) 05/18/2016   MCV 84.6 05/18/2016   PLT 183 05/18/2016    Patient Active Problem List   Diagnosis Date Noted  . Post-dates pregnancy 05/18/2016  . Bipolar 2 disorder (HCC) 04/20/2016  . Attention deficit hyperactivity disorder (ADHD) 04/20/2016  . Supervision of high risk pregnancy, antepartum 03/24/2016  . Pregnancy complicated by fetal congenital heart disease 03/24/2016    Assessment / Plan: 20 y.o. G1P0 at 1827w1d here for PDIOL  Labor: continue pitt, titrate as needed Fetal Wellbeing:  Category 1 tracing Pain Control:  Epidural in place Anticipated MOD:  vaginal  Allison Shaw, Medical Student  10/4/20178:55 PM   Seen with Allison Hazelhristina Rizk, MS-3. I agree with her assessment and plan.  Willadean CarolKaty Trishelle Devora, MD PGY-2

## 2016-05-18 NOTE — Anesthesia Preprocedure Evaluation (Signed)
Anesthesia Evaluation  Patient identified by MRN, date of birth, ID band Patient awake    Reviewed: Allergy & Precautions, NPO status , Patient's Chart, lab work & pertinent test results  Airway Mallampati: II  TM Distance: >3 FB Neck ROM: Full    Dental no notable dental hx.    Pulmonary neg pulmonary ROS,    Pulmonary exam normal        Cardiovascular negative cardio ROS Normal cardiovascular exam     Neuro/Psych Depression Bipolar Disorder negative neurological ROS     GI/Hepatic negative GI ROS, Neg liver ROS,   Endo/Other  negative endocrine ROS  Renal/GU negative Renal ROS  negative genitourinary   Musculoskeletal negative musculoskeletal ROS (+)   Abdominal   Peds negative pediatric ROS (+)  Hematology negative hematology ROS (+)   Anesthesia Other Findings   Reproductive/Obstetrics (+) Pregnancy                             Anesthesia Physical Anesthesia Plan  ASA: II  Anesthesia Plan: Epidural   Post-op Pain Management:    Induction:   Airway Management Planned:   Additional Equipment:   Intra-op Plan:   Post-operative Plan:   Informed Consent:   Plan Discussed with:   Anesthesia Plan Comments:         Anesthesia Quick Evaluation

## 2016-05-18 NOTE — Anesthesia Procedure Notes (Signed)
Epidural Patient location during procedure: OB Start time: 05/18/2016 8:20 PM End time: 05/18/2016 8:28 PM  Staffing Anesthesiologist: Bonita QuinGUIDETTI, Zakari Couchman S Performed: anesthesiologist   Preanesthetic Checklist Completed: patient identified, site marked, surgical consent, pre-op evaluation, timeout performed, IV checked, risks and benefits discussed and monitors and equipment checked  Epidural Patient position: sitting Prep: site prepped and draped and DuraPrep Patient monitoring: continuous pulse ox and blood pressure Approach: midline Location: L3-L4 Injection technique: LOR saline  Needle:  Needle type: Tuohy  Needle gauge: 17 G Needle length: 9 cm and 9 Needle insertion depth: 5 cm cm Catheter type: closed end flexible Catheter size: 19 Gauge Catheter at skin depth: 10 cm Test dose: negative  Assessment Events: blood not aspirated, injection not painful, no injection resistance, negative IV test and no paresthesia

## 2016-05-18 NOTE — Anesthesia Pain Management Evaluation Note (Signed)
  CRNA Pain Management Visit Note  Patient: Allison Shaw, 20 y.o., female  "Hello [facility]. We have an anesthesia team available at all times to provide care throughout the hospital, including epidural management and anesthesia for C-section. I don't know your plan for the delivery whether it a natural birth, water birth, IV sedation, nitrous supplementation, doula or epidural, but we want to meet your pain goals."   1.Was your pain managed to your expectations on prior hospitalizations?   No prior hospitalizations  2.What is your expectation for pain management during this hospitalization?     Labor support without medications and IV pain meds  3.How can we help you reach that goal? Interested in IV pain meds.  Also interested in natural labor.  L&D RN made aware of patient's interest in IV pain meds.  Record the patient's initial score and the patient's pain goal.   Pain: 8--L&D RN made aware.  Pain Goal: 5 [facility] wants you to be able to say your pain was always managed very well.  Maribel Hadley L 05/18/2016

## 2016-05-18 NOTE — Progress Notes (Signed)
  LABOR PROGRESS NOTE  Allison Shaw is a 20 y.o. G1P0 at 2363w1d  admitted for PDIOL  Subjective: Feeling uncomfortable, would like to proceed with epidural at this time  Objective: Ht 5\' 3"  (1.6 m)   Wt 71.7 kg (158 lb)   LMP 07/26/2015 (Exact Date)   BMI 27.99 kg/m  or  Vitals:   05/18/16 1016  Weight: 71.7 kg (158 lb)  Height: 5\' 3"  (1.6 m)     Dilation: 4.5 Effacement (%): 70 Station: -2 Presentation: Vertex Exam by:: Dr. Wonda Oldsiccio FHT: baseline 140, variable acceleration, no decels Uterine activity: irregular contractions about 10 minutes apart  Labs: Lab Results  Component Value Date   WBC 11.0 (H) 05/18/2016   HGB 11.8 (L) 05/18/2016   HCT 35.8 (L) 05/18/2016   MCV 84.6 05/18/2016   PLT 183 05/18/2016    Patient Active Problem List   Diagnosis Date Noted  . Post-dates pregnancy 05/18/2016  . Bipolar 2 disorder (HCC) 04/20/2016  . Attention deficit hyperactivity disorder (ADHD) 04/20/2016  . Supervision of high risk pregnancy, antepartum 03/24/2016  . Pregnancy complicated by fetal congenital heart disease 03/24/2016    Assessment / Plan: 20 y.o. G1P0 at 6463w1d here for PDIOL  Labor: will add pitocin  Fetal Wellbeing:  Category 1 tracing Pain Control:  Plan for epidural Anticipated MOD:  Vaginal delivery  Tillman SersAngela C Riccio, DO PGY-1 10/4/20177:56 PM

## 2016-05-19 ENCOUNTER — Encounter (HOSPITAL_COMMUNITY): Payer: Self-pay

## 2016-05-19 DIAGNOSIS — Z3A41 41 weeks gestation of pregnancy: Secondary | ICD-10-CM

## 2016-05-19 DIAGNOSIS — O480 Post-term pregnancy: Secondary | ICD-10-CM

## 2016-05-19 LAB — HIV ANTIBODY (ROUTINE TESTING W REFLEX): HIV Screen 4th Generation wRfx: NONREACTIVE

## 2016-05-19 LAB — RPR: RPR Ser Ql: NONREACTIVE

## 2016-05-19 MED ORDER — ACETAMINOPHEN 500 MG PO TABS
1000.0000 mg | ORAL_TABLET | Freq: Once | ORAL | Status: AC
  Administered 2016-05-19: 1000 mg via ORAL
  Filled 2016-05-19: qty 2

## 2016-05-19 MED ORDER — LACTATED RINGERS IV BOLUS (SEPSIS)
1000.0000 mL | Freq: Once | INTRAVENOUS | Status: AC
  Administered 2016-05-19: 1000 mL via INTRAVENOUS

## 2016-05-19 MED ORDER — ACETAMINOPHEN 325 MG PO TABS
650.0000 mg | ORAL_TABLET | ORAL | Status: DC | PRN
  Administered 2016-05-20: 650 mg via ORAL
  Filled 2016-05-19: qty 2

## 2016-05-19 MED ORDER — MISOPROSTOL 200 MCG PO TABS
800.0000 ug | ORAL_TABLET | Freq: Once | ORAL | Status: AC
  Administered 2016-05-19: 800 ug via RECTAL

## 2016-05-19 MED ORDER — DIPHENHYDRAMINE HCL 25 MG PO CAPS: 25.0000 mg | ORAL_CAPSULE | Freq: Four times a day (QID) | ORAL | Status: DC | PRN

## 2016-05-19 MED ORDER — DIBUCAINE 1 % RE OINT
1.0000 "application " | TOPICAL_OINTMENT | RECTAL | Status: DC | PRN
  Administered 2016-05-19: 1 via RECTAL
  Filled 2016-05-19: qty 28

## 2016-05-19 MED ORDER — METHYLERGONOVINE MALEATE 0.2 MG/ML IJ SOLN
0.2000 mg | Freq: Once | INTRAMUSCULAR | Status: AC
  Administered 2016-05-19: 0.2 mg via INTRAMUSCULAR

## 2016-05-19 MED ORDER — TETANUS-DIPHTH-ACELL PERTUSSIS 5-2.5-18.5 LF-MCG/0.5 IM SUSP
0.5000 mL | Freq: Once | INTRAMUSCULAR | Status: AC
  Administered 2016-05-21: 0.5 mL via INTRAMUSCULAR
  Filled 2016-05-19: qty 0.5

## 2016-05-19 MED ORDER — GENTAMICIN SULFATE 40 MG/ML IJ SOLN
5.0000 mg/kg | INTRAVENOUS | Status: DC
  Administered 2016-05-19: 360 mg via INTRAVENOUS
  Filled 2016-05-19: qty 9

## 2016-05-19 MED ORDER — COCONUT OIL OIL: 1.0000 "application " | TOPICAL_OIL | Status: DC | PRN

## 2016-05-19 MED ORDER — SENNOSIDES-DOCUSATE SODIUM 8.6-50 MG PO TABS
2.0000 | ORAL_TABLET | ORAL | Status: DC
  Administered 2016-05-20 (×2): 2 via ORAL
  Filled 2016-05-19 (×2): qty 2

## 2016-05-19 MED ORDER — METHYLERGONOVINE MALEATE 0.2 MG/ML IJ SOLN
INTRAMUSCULAR | Status: AC
  Filled 2016-05-19: qty 1

## 2016-05-19 MED ORDER — ONDANSETRON HCL 4 MG/2ML IJ SOLN: 4.0000 mg | INTRAMUSCULAR | Status: DC | PRN

## 2016-05-19 MED ORDER — ONDANSETRON HCL 4 MG PO TABS: 4.0000 mg | ORAL_TABLET | ORAL | Status: DC | PRN

## 2016-05-19 MED ORDER — IBUPROFEN 600 MG PO TABS
600.0000 mg | ORAL_TABLET | Freq: Four times a day (QID) | ORAL | Status: DC
  Administered 2016-05-19 – 2016-05-21 (×8): 600 mg via ORAL
  Filled 2016-05-19 (×8): qty 1

## 2016-05-19 MED ORDER — BENZOCAINE-MENTHOL 20-0.5 % EX AERO
1.0000 "application " | INHALATION_SPRAY | CUTANEOUS | Status: DC | PRN
  Administered 2016-05-19 – 2016-05-21 (×2): 1 via TOPICAL
  Filled 2016-05-19 (×2): qty 56

## 2016-05-19 MED ORDER — SIMETHICONE 80 MG PO CHEW: 80.0000 mg | CHEWABLE_TABLET | ORAL | Status: DC | PRN

## 2016-05-19 MED ORDER — WITCH HAZEL-GLYCERIN EX PADS
1.0000 "application " | MEDICATED_PAD | CUTANEOUS | Status: DC | PRN
  Administered 2016-05-19: 1 via TOPICAL

## 2016-05-19 MED ORDER — ZOLPIDEM TARTRATE 5 MG PO TABS: 5.0000 mg | ORAL_TABLET | Freq: Every evening | ORAL | Status: DC | PRN

## 2016-05-19 MED ORDER — SODIUM CHLORIDE 0.9 % IV SOLN
2.0000 g | Freq: Four times a day (QID) | INTRAVENOUS | Status: DC
  Administered 2016-05-19 (×2): 2 g via INTRAVENOUS
  Filled 2016-05-19 (×4): qty 2000

## 2016-05-19 MED ORDER — MISOPROSTOL 200 MCG PO TABS
ORAL_TABLET | ORAL | Status: AC
  Administered 2016-05-19: 800 ug
  Filled 2016-05-19: qty 5

## 2016-05-19 MED ORDER — PRENATAL MULTIVITAMIN CH
1.0000 | ORAL_TABLET | Freq: Every day | ORAL | Status: DC
  Administered 2016-05-20 – 2016-05-21 (×2): 1 via ORAL
  Filled 2016-05-19 (×2): qty 1

## 2016-05-19 NOTE — Anesthesia Postprocedure Evaluation (Signed)
Anesthesia Post Note  Patient: Allison Shaw  Procedure(s) Performed: * No procedures listed *  Patient location during evaluation: Mother Baby Anesthesia Type: Epidural Level of consciousness: awake and alert Pain management: pain level controlled Vital Signs Assessment: post-procedure vital signs reviewed and stable Respiratory status: spontaneous breathing, nonlabored ventilation and respiratory function stable Cardiovascular status: stable Postop Assessment: no headache, no backache and epidural receding Anesthetic complications: no     Last Vitals:  Vitals:   05/19/16 1617 05/19/16 1730  BP: (!) 108/55 123/65  Pulse: (!) 111 (!) 119  Resp: 18 20  Temp: 37 C 37.1 C    Last Pain:  Vitals:   05/19/16 1730  TempSrc: Oral  PainSc: 4    Pain Goal:                 EchoStarMERRITT,Allison Shaw

## 2016-05-19 NOTE — Lactation Note (Signed)
This note was copied from a baby's chart. Lactation Consultation Note  Patient Name: Allison Orvan SeenBrittney O'Guin WUJWJ'XToday's Date: 05/19/2016 Reason for consult: Initial assessment;Other (Comment) (baby STS sleepy and mom only awake for a few minutes )  Baby is 1 hour old @ LC visit - baby STS on moms chest asleep , color pink. Mom awake for few minutes and fell asleep. LC spoke to dad , and dad mentioned they had  Both attended the Breast feeding class and learned a lot.  LC mentioned to dad the benefits of STS , and the hormones increase.  Dad mentioned mom had been pumping a few times at home the last few weeks and were freezing the colostrum.  LC suggested bringing the milk in in a cooler.  Mother informed of post-discharge support and given phone number to the lactation department, including services for phone  call assistance; out-patient appointments; and breastfeeding support group. List of other breastfeeding resources in the community  given in the handout. Encouraged mother to call for problems or concerns related to breastfeeding. LC encouraged dad to call with feeding cues.  L+D RN aware of the above findings.    Maternal Data Has patient been taught Hand Expression?:  (baby STS , sleeping , and mom sleepy ) Does the patient have breastfeeding experience prior to this delivery?: No  Feeding    LATCH Score/Interventions                      Lactation Tools Discussed/Used WIC Program: Yes (per dad - [facility] )   Consult Status Consult Status: Follow-up Date: 05/19/16 Follow-up type: In-patient    Allison Shaw 05/19/2016, 2:49 PM

## 2016-05-19 NOTE — Progress Notes (Signed)
LABOR PROGRESS NOTE  Philippa ChesterBrittney O'Guin is a 20 y.o. G1P0 at 925w2d  admitted for PDIOL.  Subjective: Doing fine. Had a temperature to 100.8.  Objective: BP 117/75   Pulse 98   Temp 99.8 F (37.7 C) (Oral)   Resp 16   Ht 5\' 3"  (1.6 m)   Wt 71.7 kg (158 lb)   LMP 07/26/2015 (Exact Date)   SpO2 100%   BMI 27.99 kg/m  or  Vitals:   05/19/16 0330 05/19/16 0400 05/19/16 0423 05/19/16 0431  BP: 106/63 113/73  117/75  Pulse: (!) 102 99  98  Resp: 17 16  16   Temp:   (!) 100.8 F (38.2 C) 99.8 F (37.7 C)  TempSrc:   Axillary Oral  SpO2:      Weight:      Height:       Dilation: 7 Effacement (%): 80 Cervical Position: Middle Station: -1 Presentation: Vertex Exam by:: Genelle BalE. Johnson, RN   Labs: Lab Results  Component Value Date   WBC 11.0 (H) 05/18/2016   HGB 11.8 (L) 05/18/2016   HCT 35.8 (L) 05/18/2016   MCV 84.6 05/18/2016   PLT 183 05/18/2016    Patient Active Problem List   Diagnosis Date Noted  . Post-dates pregnancy 05/18/2016  . Bipolar 2 disorder (HCC) 04/20/2016  . Attention deficit hyperactivity disorder (ADHD) 04/20/2016  . Supervision of high risk pregnancy, antepartum 03/24/2016  . Pregnancy complicated by fetal congenital heart disease 03/24/2016    Assessment / Plan: 20 y.o. G1P0 at 605w2d here for PDIOL.  Labor: Progressing on Pitocin. ID: Had a temperature to 100.8. Will start Amp/Gent. Fetal Wellbeing:  Category I Pain Control:  Epidural Anticipated MOD:  SVD  Hilton SinclairKaty D Mayo, MD 05/19/2016, 4:52 AM

## 2016-05-19 NOTE — Progress Notes (Signed)
Dr Genevie AnnSchenk notified of SVE, practice pushing, pushing quality and effort, FHR tracing change with change of baseline, and variability change, interventions provided, and decels. Requested to view tracing. Plan to restart pushing in 15 mins with provider at bedside.

## 2016-05-19 NOTE — Progress Notes (Signed)
Delivery of live viable female by Dr Rogers BlockerSchneck, assisted by Dr Adrian BlackwaterStinson. NICU at bedside,

## 2016-05-19 NOTE — Progress Notes (Signed)
Patient ID: Allison Shaw, female   DOB: 26-Jan-1996, 20 y.o.   MRN: [id number] LABOR PROGRESS NOTE  Allison Shaw is a 20 y.o. G1P0 at 2121w2d  admitted for PDIOL  Subjective: Patient is doing well. Ruptured her membranes at 0050.   Objective: BP 113/81   Pulse (!) 116   Temp 98.2 F (36.8 C) (Oral)   Resp 16   Ht 5\' 3"  (1.6 m)   Wt 71.7 kg (158 lb)   LMP 07/26/2015 (Exact Date)   SpO2 100%   BMI 27.99 kg/m  or  Vitals:   05/18/16 2301 05/18/16 2331 05/19/16 0001 05/19/16 0101  BP: (!) 108/57 109/62 105/63 113/81  Pulse: 84 86 90 (!) 116  Resp: 16 16 16 16   Temp:   98.2 F (36.8 C)   TempSrc:   Oral   SpO2:      Weight:      Height:        Dilation: 5.5 Effacement (%): 70 Cervical Position: Middle Station: -2 Presentation: Vertex Exam by:: Dr. Nancy MarusMayo   Labs: Lab Results  Component Value Date   WBC 11.0 (H) 05/18/2016   HGB 11.8 (L) 05/18/2016   HCT 35.8 (L) 05/18/2016   MCV 84.6 05/18/2016   PLT 183 05/18/2016    Patient Active Problem List   Diagnosis Date Noted  . Post-dates pregnancy 05/18/2016  . Bipolar 2 disorder (HCC) 04/20/2016  . Attention deficit hyperactivity disorder (ADHD) 04/20/2016  . Supervision of high risk pregnancy, antepartum 03/24/2016  . Pregnancy complicated by fetal congenital heart disease 03/24/2016    Assessment / Plan: 20 y.o. G1P0 at 1621w2d here for PDIOL  Labor: AROM, continue pitt Pain Control:  Epidural in place Anticipated MOD:  Vagina  Harvin Hazelhristina Rizk, Medical Student  10/5/20171:13 AM   I saw the Patient with Harvin Hazelhristina Rizk, MS3. I agree with her assessment and plan.

## 2016-05-19 NOTE — Lactation Note (Signed)
This note was copied from a baby's chart. Lactation Consultation Note  Patient Name: Allison Shaw ZOXWR'UToday's Date: 05/19/2016 Reason for consult: Follow-up assessment Baby at 9 hr of life. RN requested lactation. Upon entry [name] was sleeping on the couch, mom was laying in the bed with baby sleeping in her arms. Mom stated that as soon as she lays baby in the basinet baby wakes and cries. The only way to keep the baby sleeping is to hold her. Discussed normal baby behavior. Suggested that mom and dad take turns caring for the baby tonight. Mom only voiced sleep related questions at this visit. She declined help with latch.    Maternal Data    Feeding    LATCH Score/Interventions                      Lactation Tools Discussed/Used     Consult Status Consult Status: Follow-up Date: 05/20/16 Follow-up type: In-patient    Rulon Eisenmengerlizabeth E Emanie Behan 05/19/2016, 10:48 PM

## 2016-05-19 NOTE — Progress Notes (Signed)
Spoke with Cresenzo-Dishmon, CNM regarding >850 on bladder scan with significant peri edema. Pt had 2 I&O caths done already. Indwelling cathater placed with >102400ml output. Fundus down to 1 above and now midline.

## 2016-05-19 NOTE — Lactation Note (Signed)
Lactation Consultation Note  Patient Name: Allison SeenBrittney O'Guin RUEAV'WToday's Date: 05/19/2016 Reason for consult: Initial assessment;Other (Comment) (P1, mom awake for few minutes - sleepy , see LC note )     Maternal Data Has patient been taught Hand Expression?:  (baby STS on moms chest sound asleep , mom sleepy . ) Does the patient have breastfeeding experience prior to this delivery?: No  Feeding    LATCH Score/Interventions                      Lactation Tools Discussed/Used WIC Program: Yes (per dad , [facility] )   Consult Status Consult Status: Follow-up Date: 05/19/16 Follow-up type: In-patient    Allison Shaw 05/19/2016, 2:46 PM

## 2016-05-19 NOTE — Progress Notes (Signed)
Called to room to eval pt. Pt now completed. After 15 minutes of pushing noted decelerations and fetal tachycardia. Mother is now febrile. Due for antibiotics and tylenol. Given 1g tylenol and next does of amp. Pushed with pt. Progress being made. 0 to +1 station. Baby remains tachycardic, but tolerating pushing. Expect SVD.

## 2016-05-20 LAB — CBC
HEMATOCRIT: 23.7 % — AB (ref 36.0–46.0)
HEMOGLOBIN: 8.1 g/dL — AB (ref 12.0–15.0)
MCH: 28.4 pg (ref 26.0–34.0)
MCHC: 34.2 g/dL (ref 30.0–36.0)
MCV: 83.2 fL (ref 78.0–100.0)
Platelets: 170 10*3/uL (ref 150–400)
RBC: 2.85 MIL/uL — ABNORMAL LOW (ref 3.87–5.11)
RDW: 16 % — ABNORMAL HIGH (ref 11.5–15.5)
WBC: 17.8 10*3/uL — AB (ref 4.0–10.5)

## 2016-05-20 LAB — CCBB MATERNAL DONOR DRAW

## 2016-05-20 MED ORDER — OXYCODONE-ACETAMINOPHEN 5-325 MG PO TABS
1.0000 | ORAL_TABLET | Freq: Four times a day (QID) | ORAL | Status: DC | PRN
  Administered 2016-05-20: 1 via ORAL
  Filled 2016-05-20: qty 1

## 2016-05-20 MED ORDER — FERROUS SULFATE 325 (65 FE) MG PO TABS
325.0000 mg | ORAL_TABLET | Freq: Every day | ORAL | Status: DC
  Administered 2016-05-20 – 2016-05-21 (×2): 325 mg via ORAL
  Filled 2016-05-20 (×3): qty 1

## 2016-05-20 NOTE — Plan of Care (Signed)
Problem: Urinary Elimination: Goal: Ability to reestablish a normal urinary elimination pattern will improve Outcome: Not Met (add Reason) During the physical assessment done at 1139, the patient's perineum appeared grossly edematous. There is grayish blue bruising over the distal left labia majora. There is a large cluster of moderate-sized hemorrhoid tags. The left labial tear repair and the perineal laceration repair are approximated. Patient has been wearing the cold pads and changing them every several hours. She has not been able to use the Dermaplast spray. The Foley catheter is patent and draining clear amber urine.  Perineal care and Foley care reviewed with patient and demonstrated. Dermaplast spray applied. A padded ice glove was applied to the perineum to better cover and expedite resolution of the edema. PO fluids were encouraged and rationale explained. Patient verbalized understanding of this information, stating that the ice glove provided greater comfort than the cold pads.   I plan to discuss plan of care with obstetrical care provider once they make patient rounds this morning.

## 2016-05-20 NOTE — Clinical Social Work Maternal (Signed)
  CLINICAL SOCIAL WORK MATERNAL/CHILD NOTE  Patient Details  Name: Allison Shaw MRN: [id number] Date of Birth: 1996-05-11  Date:  05/20/2016  Clinical Social Worker Initiating Note:  Laurey Arrow Date/ Time Initiated:  05/20/16/1357     Child's Name:  [facility]   Legal Guardian:  Father   Need for Interpreter:  None   Date of Referral:  05/20/16     Reason for Referral:  [place], including SI    Referral Source:  [facility]   Address:  [address] [place] [address]  Phone number:  [phone]   Household Members:  Self, Relatives, Significant Other   Natural Supports (not living in the home):  Extended Family, Immediate Family, Spouse/significant other, Parent   Professional Supports: None   Employment: Animator, Retail buyer   Type of Work: Counsellor as a Corporate treasurer Resources:  Medicaid   Other Resources:      Cultural/Religious Considerations Which May Impact Care:  None Reported  Strengths:  Ability to meet basic needs , Engineer, materials , Home prepared for child , Understanding of illness   Risk Factors/Current Problems:  Mental Health Concerns    Cognitive State:  Alert , Able to Concentrate , Linear Thinking , Insightful    Mood/Affect:  Flat , Calm , Comfortable , Interested    CSW Assessment: CSW met with [name] to complete an assessment for hx ADHD,Depression, and Bipolar dx. [name] was polite and inviting.  [name] introduced her room guest as [name] Berniece Salines), and gave CSW permission to meet with [name] while [name] was present. CSW inquired about [name]'s MH hx and CSW acknowledged a hx of ADHD since age 20.  [name] communicated that [name] was regulated on medication through out middle and high school for ADHD. CSW also acknowledged a hx of depression and bipolar dx.  [name] reported becoming depressed after the passing of [name]'s maternal grandfather.  [name] denies being prescribed  medication and denies SI.  [name] acknowledged a dx of bipolar and communicated that [name] was prescribed medication about 1 year ago. [name] stated that [name] no longer took her medications for bipolar after [name]'s pregnancy was confirmed, however, [name] is interested in re-initiating her medications.  CSW provided [name] wither resource for outpatient counseling and medication management since [name] recently relocated to [place]. [name] was accepting of resources and assured CSW that [name] was going to make contact.   CSW also educated [name] about PPD. CSW informed [name] of possible supports and interventions to decrease PPD.  CSW also encouraged [name] to seek medical attention if needed for increased signs and symptoms for PPD.  CSW thanked [name] and [name] for meeting with CSW. They did not have any additional questions or concerns at this time.   CSW Plan/Description:  Information/Referral to [employer] , [employer] , No Further Intervention Required/No Barriers to Discharge   Laurey Arrow, MSW, LCSW Clinical Social Work [phone]    Dimple Nanas, LCSW 05/20/2016, 2:00 PM

## 2016-05-20 NOTE — Progress Notes (Signed)
UR chart review completed.  

## 2016-05-20 NOTE — Lactation Note (Addendum)
This note was copied from a baby's chart. Lactation Consultation Note New mom has short shaft/flat nipple to Lt. Breast that isn't compressible. The Rt. Nipple has longer shaft and more compressible. Fitted breast w/#20 NS. Shells given to evert nipples. Mom has cone shaped breast. Hand expressed w/colostrum noted. Mom stated she has been leaking and has colostrum in freezer at home.  Baby turned before delivery, has very bad cephalhematoma to Lt. Posterior crown. Baby has fussed, and cried if touched. Mom states baby has been spitty and hasn't BF. Baby cries when put to breast. Mom has been doing STS all night. Mom states that baby probably hasn't slept but 30 min. All night.  In football position attempted to latch baby w/Rt. Side up. Baby screamed refusing breast. Baby positioned side lying w/baby's body straight out to the side instead of tucked close to mom in football position. Baby BF for a minute fussy at times.  Tried laid back position, baby suckled a few times during crying. Baby is hungry, mom has good colostrum in NS. Tried cross-cradle, baby screamed. ROM performed to bilateral arms w/no crying. In cradle attempted again w/Lt. Side up and LC holding breast firm for latching. Baby BF 10 mins. Started gagging and choked X2. Turned acrocyanotic around mouth. Cleared quickly after approximately 1-2 minutes. No emesis noted. Encouraged mom to hold baby upright for a while. Used props for comfort for mom while holding baby. Baby fell asleep.  Had asked RN early to ask MD for tylenol d/t wouldn't BF d/t LC feels in pain. Mom shown how to use DEBP & how to disassemble, clean, & reassemble parts. Mom knows to pump q3h for 15-20 min. Reported to [facility] RN to report to MD also. ROM to bilatteraly arms done w/o crying.  Patient Name: Allison Shaw WUJWJ'XToday's Date: 05/20/2016 Reason for consult: Follow-up assessment;Difficult latch   Maternal Data    Feeding Feeding Type: Breast  Fed Length of feed: 10 min  LATCH Score/Interventions Latch: Repeated attempts needed to sustain latch, nipple held in mouth throughout feeding, stimulation needed to elicit sucking reflex. Intervention(s): Skin to skin;Teach feeding cues;Waking techniques Intervention(s): Adjust position;Assist with latch;Breast massage;Breast compression  Audible Swallowing: Spontaneous and intermittent Intervention(s): Hand expression;Skin to skin  Type of Nipple: Everted at rest and after stimulation  Comfort (Breast/Nipple): Soft / non-tender     Hold (Positioning): Full assist, staff holds infant at breast Intervention(s): Breastfeeding basics reviewed;Support Pillows;Position options;Skin to skin  LATCH Score: 7  Lactation Tools Discussed/Used Tools: Shells;Nipple Dorris CarnesShields;Pump Nipple shield size: 20 Shell Type: Inverted Breast pump type: Double-Electric Breast Pump Pump Review: Setup, frequency, and cleaning;Milk Storage Initiated by:: Peri JeffersonL. Briggette Najarian RN IBCLC Date initiated:: 05/20/16   Consult Status Consult Status: Follow-up Date: 05/20/16 Follow-up type: In-patient    Zidane Renner, Diamond NickelLAURA G 05/20/2016, 6:05 AM

## 2016-05-20 NOTE — Lactation Note (Signed)
This note was copied from a baby's chart. Lactation Consultation Note  Asked to assist with feeding Allison Shaw who would not attach to the breast.  Attempted spoon feeding. Allison Shaw stuck her tongue out and tasted the milk but did not continue to lap it up. She did make an attempt to "slurp" it but did not close her mouth to make a seal. Attempted feeding with a curved tip syringe. Allison Shaw was tongue thrusting and biting.  She would not suck on gloved finger. TMJ massage did help decrease the biting though she still would not suck on a gloved finger. She has an anterior bubble palate and a frenum is palpable with the Murphy maneuver.  Allison Shaw was satisfied after 5 ml was slowly pushed into her mouth. Plan for now is for mother to pump and eat her lunch.  Next feeding will [facility] or [facility].  Patient Name: Girl Orvan SeenBrittney O'Guin ZOXWR'UToday's Date: 05/20/2016 Reason for consult: Follow-up assessment   Maternal Data    Feeding Feeding Type: Breast Milk  LATCH Score/Interventions Latch: Too sleepy or reluctant, no latch achieved, no sucking elicited. Intervention(s): Waking techniques;Teach feeding cues Intervention(s): Adjust position  Audible Swallowing: None Intervention(s): Hand expression  Type of Nipple: Everted at rest and after stimulation  Comfort (Breast/Nipple): Soft / non-tender     Hold (Positioning): Full assist, staff holds infant at breast Intervention(s): Breastfeeding basics reviewed;Support Pillows;Position options  LATCH Score: 4  Lactation Tools Discussed/Used Tools: Nipple Dorris CarnesShields;Pump Nipple shield size: 20 Breast pump type: Manual   Consult Status Consult Status: Follow-up Date: 05/21/16 (sooner if needed) Follow-up type: In-patient    Soyla DryerJoseph, Galadriel Shroff 05/20/2016, 1:30 PM

## 2016-05-20 NOTE — Progress Notes (Signed)
Patient ID: Allison SeenBrittney Shaw, female   DOB: Dec 05, 1995, 20 y.o.   MRN: [id number]  POSTPARTUM PROGRESS NOTE  Post Partum Day 1 Subjective:  Allison Shaw is a 20 y.o. G1P1001 4953w2d s/p VAVD.  No acute events overnight.  Pt denies problems with ambulating or po intake.  She had difficulty urinating due to swelling and had a Foley placed last night. She denies nausea or vomiting.  Pain is moderately controlled.  She has had flatus. She has not had bowel movement.  Lochia Small.   Objective: Blood pressure (!) 108/50, pulse (!) 108, temperature 98.4 F (36.9 C), temperature source Oral, resp. rate 18, height 5\' 3"  (1.6 m), weight 71.7 kg (158 lb), last menstrual period 07/26/2015, SpO2 100 %, unknown if currently breastfeeding.  Physical Exam:  General: alert, cooperative and no distress Lochia:normal flow Chest: CTAB Heart: RRR no m/r/g Abdomen: +BS, soft, nontender GU: vaginal swelling, foley in place Uterine Fundus: firm, below level of umbilicus DVT Evaluation: No calf swelling or tenderness Extremities: no edema   Recent Labs  05/18/16 1010 05/20/16 0628  HGB 11.8* 8.1*  HCT 35.8* 23.7*    Assessment/Plan:  ASSESSMENT: Allison Shaw is a 20 y.o. G1P1001 6153w2d s/p VAVD Hemoglobin 8.1 today, added daily iron supplement  Plan for discharge tomorrow   LOS: 2 days   Tillman Sersngela C Riccio, DO 05/20/2016, 7:47 AM

## 2016-05-20 NOTE — Progress Notes (Signed)
OB Attending  Pt with labial swelling due to VAVD. Foley placed last evening at 10:30 PM due to urinary retention. Using ice packs  PE   Labial swelling noted, foley in place  A/P PPD # 1 VAVD        Urinary retention secondary to labial swelling  Discussed with pt. Will continue with ice packs for next 24 to 48 hrs. Add stronger pain medication. Remove foley at 1800 and follow.

## 2016-05-21 MED ORDER — IBUPROFEN 600 MG PO TABS: 600.0000 mg | ORAL_TABLET | Freq: Four times a day (QID) | ORAL | 0 refills | Status: DC | PRN

## 2016-05-21 MED ORDER — FERROUS SULFATE 325 (65 FE) MG PO TABS: 325.0000 mg | ORAL_TABLET | Freq: Two times a day (BID) | ORAL | 3 refills | Status: DC

## 2016-05-21 NOTE — Discharge Summary (Signed)
OB Discharge Summary     Patient Name: Allison Shaw DOB: 10-14-1995 MRN: [id number]  Date of admission: 05/18/2016 Delivering MD: Ernestina PennaSCHENK, NICHOLAS MICHAEL   Date of discharge: 05/21/2016  Admitting diagnosis: INDUCTION Intrauterine pregnancy: 1448w2d     Secondary diagnosis:  Active Problems:   Post-dates pregnancy  Additional problems: none     Discharge diagnosis: Term Pregnancy Delivered and Anemia                                                                                                Post partum procedures:none  Augmentation: AROM, Pitocin, Cytotec and Foley Balloon  Complications: Intrauterine Inflammation or infection (Chorioamniotis)- rec'd Amp and Gent in labor  Hospital course:  Induction of Labor With Vaginal Delivery   20 y.o. yo G1P1001 at 5748w2d was admitted to the hospital 05/18/2016 for induction of labor.  Indication for induction: Postdates.  Patient had an uncomplicated labor course as follows: Membrane Rupture Time/Date: 12:50 AM ,05/19/2016   Intrapartum Procedures: Episiotomy: None [1]                                         Lacerations:  2nd degree [3];Labial [10];Vaginal [6]  Patient had delivery of a Viable infant.  Information for the patient's newborn:  Kirke CorinO'Guin, Girl Philippa ChesterBrittney [[id number]][[id number]]  Delivery Method: Vag-Spont  Vacuum Assisted Vag Del  05/19/2016  Details of delivery can be found in separate delivery note.  Patient had a routine postpartum course. Patient is discharged home 05/21/16.   Physical exam Vitals:   05/20/16 0545 05/20/16 0848 05/20/16 1829 05/21/16 0533  BP: (!) 108/50 (!) 99/45 124/64 (!) 107/57  Pulse: (!) 108 96 98 94  Resp: 18 20 20 18   Temp: 98.4 F (36.9 C) 98.1 F (36.7 C) 98.6 F (37 C) 98 F (36.7 C)  TempSrc: Oral Oral Oral Oral  SpO2:      Weight:      Height:       General: alert and cooperative Lochia: appropriate Uterine Fundus: firm Incision: N/A DVT Evaluation: No evidence of DVT seen on physical  exam. Labs: Lab Results  Component Value Date   WBC 17.8 (H) 05/20/2016   HGB 8.1 (L) 05/20/2016   HCT 23.7 (L) 05/20/2016   MCV 83.2 05/20/2016   PLT 170 05/20/2016   No flowsheet data found.  Discharge instruction: per After Visit Summary and "Baby and Me Booklet".  After visit meds:    Medication List    STOP taking these medications   acetaminophen 325 MG tablet Commonly known as:  TYLENOL   calcium carbonate 500 MG chewable tablet Commonly known as:  TUMS - dosed in mg elemental calcium   famotidine 40 MG tablet Commonly known as:  PEPCID     TAKE these medications   ferrous sulfate 325 (65 FE) MG tablet Take 1 tablet (325 mg total) by mouth 2 (two) times daily with a meal.   ibuprofen 600 MG tablet Commonly known as:  ADVIL,MOTRIN Take 1 tablet (  600 mg total) by mouth every 6 (six) hours as needed.   prenatal multivitamin Tabs tablet Take 1 tablet by mouth at bedtime.       Diet: routine diet  Activity: Advance as tolerated. Pelvic rest for 6 weeks.   Outpatient follow up:6 weeks Follow up Appt:Future Appointments Date Time Provider Department Center  06/27/2016 2:40 PM [facility] [place], [place] [facility]   Follow up Visit:No Follow-up on file.  Postpartum contraception: Depo Provera  Newborn Data: Live born female  Birth Weight: 8 lb 1 oz (3657 g) APGAR: 5, 8  Baby Feeding: Breast Disposition:home with mother   05/21/2016 Cam Hai, CNM  9:43 AM

## 2016-05-21 NOTE — Lactation Note (Signed)
This note was copied from a baby's chart. Lactation Consultation Note  Baby sleeping.  [name] states she recently gave baby 12 ml. Reviewed volume guidelines increasing as baby desires. Mother has DEBP at home. States she continues to attempt latching baby but baby only cries and then she gives her breastmilk bottle. Suggest she call for IBCLC to assist with next feeding. Set up OP appt 10/11 4pm. Mom knows to pump q3h for 15-20 min. Reviewed engorgement care and monitoring voids/stools. Mom encouraged to feed baby 8-12 times/24 hours and with feeding cues.      Patient Name: Allison Shaw ZOXWR'UToday's Date: 05/21/2016 Reason for consult: Follow-up assessment   Maternal Data    Feeding    LATCH Score/Interventions                      Lactation Tools Discussed/Used     Consult Status Consult Status: Follow-up Date: 05/25/16 Follow-up type: Out-patient    Dahlia ByesBerkelhammer, Ruth [facility] 05/21/2016, 12:05 PM

## 2016-05-21 NOTE — Discharge Instructions (Signed)

## 2016-05-22 ENCOUNTER — Ambulatory Visit: Payer: Self-pay

## 2016-05-22 NOTE — Lactation Note (Signed)
This note was copied from a baby's chart. Lactation Consultation Note  Baby 6766 hours old.  Breasts are filling.  Significant bruising on crown.  Baby cueing in crib.  Noted short anterior lingual frenulum and tongue thin when infant cries. Mother's R breast and nipple is larger than L.  Short shaft nipples. Mother states she has been doing lots of STS and has been able to recently latch baby with #20NS and per mom sustained latch for 15 min. During consult, mother applied NS.  IBCLC demonstrated how to prefill with BM using curved tip syringe. Baby sucked a few time and fell back asleep.  Discussed keeping baby deep on NS during feeding.  Provided extra #20NS. Suggest once baby is in a pattern, half way through feeding mother can try latching without NS. Mother recently pumped almost 4 oz.  She will give baby volume desired with slow flow bottle nipple. Encouraged mother to continue working at breastfeeding and post pump w/ DEBP after feedings. Call if she would like assistance w/ latching.  OP appointment on 10/11    Patient Name: Allison Philippa ChesterBrittney Shaw RUEAV'WToday's Date: 05/22/2016 Reason for consult: Follow-up assessment   Maternal Data    Feeding Feeding Type: Breast Fed Length of feed: 15 min  LATCH Score/Interventions                      Lactation Tools Discussed/Used Tools: 98F feeding tube / Syringe;Nipple Shields Nipple shield size: 20   Consult Status Consult Status: Follow-up Date: 05/25/16 Follow-up type: Out-patient    Dahlia ByesBerkelhammer, Meliah Appleman [facility] 05/22/2016, 8:44 AM

## 2016-05-25 ENCOUNTER — Ambulatory Visit (HOSPITAL_COMMUNITY)
Admit: 2016-05-25 | Discharge: 2016-05-25 | Disposition: A | Discharge status: Home / Self Care | Payer: Medicaid Other | Attending: Obstetrics and Gynecology | Admitting: Obstetrics and Gynecology

## 2016-05-25 NOTE — Lactation Note (Signed)
Lactation Consult for Pilgrim's PrideBrittney O'Guinn (mother) and [place] M. Staley (DOB: [birth year])  Mother's reason for visit: f/u Consult:  Initial Lactation Consultant:  Remigio Eisenmengerichey, Gursimran Litaker Hamilton  ________________________________________________________________________ BW: 3657g (8# 1oz) In-hospital: 7# 6oz Mon: 7# 9oz Today's weight: 7# 10.2 _______________________________________________________________________  Mother's Name: Aracelly O'Guin Breastfeeding Experience: primip Maternal Medical Conditions:  Hx bipolar and depression Maternal Medications: Iron, IB, stool softener  ________________________________________________________________________  Breastfeeding History (Post Discharge)  Frequency of breastfeeding: q3h    Duration of feeding: 30-45 min  Pumping  Type of pump:  Medela pump in style Frequency: qd or bid Volume: 10 oz/session   Infant Intake and Output Assessment  Voids: 5-6 in 24 hrs.  Color:  Clear yellow Stools: 4-5 in 24 hrs.  Color:  Yellow  ________________________________________________________________________  Maternal Breast Assessment  Breast:  Full Nipple:  Flat  _______________________________________________________________________ Feeding Assessment/Evaluation  Initial feeding assessment:  Infant's oral assessment:  WNL  ttached assessment:  Deep  Lips flanged:  Yes.    Tools:  Nipple shield 20 mm Instructed on use and cleaning of tool:  Yes.    Pre-feed weight: 3466 g  Post-feed weight: 3524 g  Amount transferred: 58 ml 5 minute feed    Total amount transferred:  58 ml+ (infant was fed an additional time for about 3 minutes prior to being put in car seat; this was not weighed).   Hope is 766 days old and is 5% below BW. Her mother has an abundant supply (she can pump 10 oz in 15 min). Mom reports feeding Hope 8 times/day; infant receives 1-2 bottle-feedings/day (containing 2-2.5oz of EBM). Mom has a history of bipolar, ADHD, &  depression. She has not been on meds for almost 1 year. Mom reports doing well (and this was validated by [name], who has been in a relationship with mother for 3 yrs).   Mom has flat nipples; she has been using a size 20 nipple shield. Hope latched (with the nipple shield) with ease. She transferred almost 2 oz in about 5 minutes & was satiated. Mom has no breast complaints.   Near the end of the consult, I did teach parents an additional feeding cue that they had not recognized as a feeding cue. Hope was then placed back onto the breast where she latched w/ease, fed for about 3 minutes, and then fell asleep.  With mom's abundant supply & their recognition of an additional feeding cue, I anticipate that [facility] will gain weight quickly. I have no concerns.   How to properly warm expressed breast milk was also discussed during consult.  Glenetta HewKim Nikelle Malatesta, RN, IBCLC

## 2016-05-27 ENCOUNTER — Encounter (HOSPITAL_COMMUNITY): Payer: Self-pay | Admitting: *Deleted

## 2016-05-27 ENCOUNTER — Inpatient Hospital Stay (HOSPITAL_COMMUNITY)
Admission: AD | Admit: 2016-05-27 | Discharge: 2016-05-27 | Disposition: A | Discharge status: Home / Self Care | Payer: Medicaid Other | Source: Ambulatory Visit | Attending: Obstetrics & Gynecology | Admitting: Obstetrics & Gynecology

## 2016-05-27 DIAGNOSIS — O9089 Other complications of the puerperium, not elsewhere classified: Secondary | ICD-10-CM | POA: Diagnosis present

## 2016-05-27 DIAGNOSIS — R339 Retention of urine, unspecified: Secondary | ICD-10-CM

## 2016-05-27 LAB — URINALYSIS W MICROSCOPIC (NOT AT ARMC)
Bilirubin Urine: NEGATIVE
GLUCOSE, UA: NEGATIVE mg/dL
Hgb urine dipstick: NEGATIVE
KETONES UR: NEGATIVE mg/dL
LEUKOCYTES UA: NEGATIVE
Nitrite: NEGATIVE
PROTEIN: NEGATIVE mg/dL
RBC / HPF: NONE SEEN RBC/hpf (ref 0–5)
Specific Gravity, Urine: 1.025 (ref 1.005–1.030)
WBC UA: NONE SEEN WBC/hpf (ref 0–5)
pH: 5.5 (ref 5.0–8.0)

## 2016-05-27 LAB — BASIC METABOLIC PANEL
Anion gap: 9 (ref 5–15)
BUN: 14 mg/dL (ref 6–20)
CHLORIDE: 107 mmol/L (ref 101–111)
CO2: 21 mmol/L — AB (ref 22–32)
CREATININE: 0.72 mg/dL (ref 0.44–1.00)
Calcium: 9.2 mg/dL (ref 8.9–10.3)
GFR calc Af Amer: >60 mL/min (ref >60)
GFR calc non Af Amer: >60 mL/min (ref >60)
Glucose, Bld: 85 mg/dL (ref 65–99)
Potassium: 3.6 mmol/L (ref 3.5–5.1)
Sodium: 137 mmol/L (ref 135–145)

## 2016-05-27 LAB — CBC
HCT: 31.4 % — ABNORMAL LOW (ref 36.0–46.0)
Hemoglobin: 10.1 g/dL — ABNORMAL LOW (ref 12.0–15.0)
MCH: 27.4 pg (ref 26.0–34.0)
MCHC: 32.2 g/dL (ref 30.0–36.0)
MCV: 85.1 fL (ref 78.0–100.0)
PLATELETS: 385 10*3/uL (ref 150–400)
RBC: 3.69 MIL/uL — ABNORMAL LOW (ref 3.87–5.11)
RDW: 15.8 % — AB (ref 11.5–15.5)
WBC: 6.9 10*3/uL (ref 4.0–10.5)

## 2016-05-27 NOTE — Discharge Instructions (Signed)
Acute Urinary Retention, Female °Acute urinary retention is the temporary inability to urinate. This is an uncommon problem in women. It can be caused by: °· Infection. °· A side effect of a medicine. °· A problem in a nearby organ that presses or squeezes on the bladder or the urethra (the tube that drains the bladder). °· Psychological problems. °·  Surgery on your bladder, urethra, or pelvic organs that causes obstruction to the outflow of urine from your bladder. °HOME CARE INSTRUCTIONS  °If you are sent home with a Foley catheter and a drainage system, you will need to discuss the best course of action with your health care provider. While the catheter is in, maintain a good intake of fluids. Keep the drainage bag emptied and lower than your catheter. This is so that contaminated urine will not flow back into your bladder, which could lead to a urinary tract infection. °There are two main types of drainage bags. One is a large bag that usually is used at night. It has a good capacity that will allow you to sleep through the night without having to empty it. The second type is called a leg bag. It has a smaller capacity so it needs to be emptied more frequently. However, the main advantage is that it can be attached by a leg strap and goes underneath your clothing, allowing you the freedom to move about or leave your home. °Only take over-the-counter or prescription medicines for pain, discomfort, or fever as directed by your health care provider.  °SEEK MEDICAL CARE IF: °· You develop a low-grade fever. °· You experience spasms or leakage of urine with the spasms. °SEEK IMMEDIATE MEDICAL CARE IF:  °· You develop chills or fever. °· Your catheter stops draining urine. °· Your catheter falls out. °· You start to develop increased bleeding that does not respond to rest and increased fluid intake. °MAKE SURE YOU: °· Understand these instructions. °· Will watch your condition. °· Will get help right away if you are  not doing well or get worse. °  °This information is not intended to replace advice given to you by your health care provider. Make sure you discuss any questions you have with your health care provider. °  °Document Released: 07/31/2006 Document Revised: 12/16/2014 Document Reviewed: 01/10/2013 °Elsevier Interactive Patient Education ©2016 [employer] ° °

## 2016-05-27 NOTE — MAU Provider Note (Signed)
History     CSN: [id number]  Arrival date and time: 05/27/16 1403    Chief Complaint  Patient presents with  . Urinary Retention  . Recurrent UTI   HPI   Patient is a 20 year old 20 year old G1 P1 at postpartum day 8. She reports that ever since discharge she's had significant difficulty urinating. She says his been about 24 hours since she last urinated and at that time she really had to work and strain to urinate. She did have a vacuum delivery after prolonged second stage. She did have a second degree tear which was repaired with 3-0 Vicryl. She reports she feels is healing well. She does not report dysuria urination but does report suprapubic pain when she urinates. She has no other complaints denies fevers or chills.  OB History    Gravida Para Term Preterm AB Living   1 1 1     1    SAB TAB Ectopic Multiple Live Births         0 1      Past Medical History:  Diagnosis Date  . ADHD (attention deficit hyperactivity disorder)   . Bipolar 1 disorder (HCC)   . Depression   . Mental disorder     Past Surgical History:  Procedure Laterality Date  . 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 Mental illness Mother   . Depression Mother     Social History  Substance Use Topics  . Smoking status: Never Smoker  . Smokeless tobacco: Never Used  . Alcohol use No    Allergies:  Allergies  Allergen Reactions  . Latex Itching and Other (See Comments)    Reaction:  Burning   . Fish Allergy Rash    Prescriptions Prior to Admission  Medication Sig Dispense Refill Last Dose  . docusate sodium (COLACE) 100 MG capsule Take 100 mg by mouth daily.   05/27/2016 at Unknown time  . ferrous sulfate 325 (65 FE) MG tablet Take 1 tablet (325 mg total) by mouth 2 (two) times daily with a meal. 60 tablet 3 05/27/2016 at Unknown time  . ibuprofen (ADVIL,MOTRIN) 600 MG tablet Take 1 tablet (600 mg total) by mouth every 6  (six) hours as needed. 30 tablet 0 05/26/2016 at Unknown time  . Prenatal Vit-Fe Fumarate-FA (PRENATAL MULTIVITAMIN) TABS tablet Take 1 tablet by mouth at bedtime.   05/26/2016 at Unknown time    Review of Systems  Constitutional: Negative for chills and fever.  HENT: Negative for congestion.   Eyes: Negative for blurred vision and double vision.  Respiratory: Negative for cough and hemoptysis.   Cardiovascular: Negative for chest pain and palpitations.  Gastrointestinal: Negative for abdominal pain, heartburn, nausea and vomiting.  Genitourinary: Positive for hematuria. Negative for dysuria, frequency and urgency.  Musculoskeletal: Negative for myalgias and neck pain.  Skin: Negative for itching and rash.  Neurological: Negative for dizziness, tingling and headaches.   Physical Exam   Blood pressure 116/66, pulse 101, temperature 97.9 F (36.6 C), temperature source Oral, resp. rate 18, unknown if currently breastfeeding.  Physical Exam  Constitutional: She is oriented to person, place, and time. She appears well-developed and well-nourished.  HENT:  Head: Normocephalic and atraumatic.  Neck: Normal range of motion. Neck supple.  Cardiovascular: Normal rate, regular rhythm and normal heart sounds.   Respiratory: Effort normal and breath sounds normal. No respiratory distress.  GI: Soft. Bowel sounds are  normal. There is no tenderness. There is no rebound and no guarding.  Palpable bladder in the suprapubic area  Genitourinary:  Genitourinary Comments: Perineum intact with healing tear sutures still in place. Interval blood at the introitus. No significant labial swelling noted. No tears noted near the urethra.  Neurological: She is alert and oriented to person, place, and time.  Skin: Skin is warm and dry.  Psychiatric: She has a normal mood and affect.    MAU Course  Procedures  MDM  In the MAU patient underwent planning scanning which revealed a proximal Lane 900 mL of  urine. A Foley catheter was replaced which relieved her proximally 900 mL of urine. Patient was informed that likely she should leave this Foley catheter in until early next week to help with overdistention of the bladder. Patient is in agreement with this plan. Urinalysis was sent from the catheterized specimen which did not reveal any signs of infection. A culture was sent however. Additionally a CBC and BMP was ordered to ensure patient did not have a white count or that patient was not in renal failure from obstruction. Both were within normal limits.  Assessment and Plan  Acute urinary retention: Likely was initially secondary to swelling in the area after vacuum delivery however it following that was likely secondary to overdistention. Will leave Foley catheter in and Monday or Tuesday at which point it will be removed in patient can have a trial of voiding. Patient discharged home with a leg bag.  Ernestina Penna 05/27/2016, 2:44 PM

## 2016-05-28 LAB — URINE CULTURE: Culture: NO GROWTH

## 2016-05-31 ENCOUNTER — Ambulatory Visit: Payer: Medicaid Other | Admitting: *Deleted

## 2016-05-31 VITALS — BP 102/64 | HR 80

## 2016-05-31 DIAGNOSIS — R300 Dysuria: Secondary | ICD-10-CM

## 2016-05-31 DIAGNOSIS — R339 Retention of urine, unspecified: Secondary | ICD-10-CM

## 2016-05-31 LAB — POCT URINALYSIS DIP (DEVICE)
GLUCOSE, UA: NEGATIVE mg/dL
Ketones, ur: NEGATIVE mg/dL
Leukocytes, UA: NEGATIVE
Nitrite: NEGATIVE
PH: 6 (ref 5.0–8.0)
Specific Gravity, Urine: >=1.03 (ref 1.005–1.030)
UROBILINOGEN UA: 1 mg/dL (ref 0.0–1.0)

## 2016-05-31 MED ORDER — PHENAZOPYRIDINE HCL 200 MG PO TABS: 200.0000 mg | ORAL_TABLET | Freq: Three times a day (TID) | ORAL | 1 refills | Status: DC | PRN

## 2016-06-01 LAB — URINE CULTURE: ORGANISM ID, BACTERIA: NO GROWTH

## 2016-06-19 ENCOUNTER — Inpatient Hospital Stay (HOSPITAL_COMMUNITY)
Admission: AD | Admit: 2016-06-19 | Discharge: 2016-06-19 | Disposition: A | Discharge status: Home / Self Care | Payer: Medicaid Other | Source: Ambulatory Visit | Attending: Obstetrics and Gynecology | Admitting: Obstetrics and Gynecology

## 2016-06-19 ENCOUNTER — Encounter (HOSPITAL_COMMUNITY): Payer: Self-pay

## 2016-06-19 DIAGNOSIS — O9089 Other complications of the puerperium, not elsewhere classified: Secondary | ICD-10-CM | POA: Insufficient documentation

## 2016-06-19 DIAGNOSIS — R339 Retention of urine, unspecified: Secondary | ICD-10-CM

## 2016-06-19 DIAGNOSIS — R300 Dysuria: Secondary | ICD-10-CM | POA: Diagnosis not present

## 2016-06-19 LAB — URINE MICROSCOPIC-ADD ON

## 2016-06-19 LAB — URINALYSIS, ROUTINE W REFLEX MICROSCOPIC
BILIRUBIN URINE: NEGATIVE
Bilirubin Urine: NEGATIVE
GLUCOSE, UA: NEGATIVE mg/dL
GLUCOSE, UA: NEGATIVE mg/dL
Ketones, ur: NEGATIVE mg/dL
Ketones, ur: NEGATIVE mg/dL
LEUKOCYTES UA: NEGATIVE
NITRITE: NEGATIVE
Nitrite: NEGATIVE
PROTEIN: NEGATIVE mg/dL
Protein, ur: NEGATIVE mg/dL
SPECIFIC GRAVITY, URINE: 1.015 (ref 1.005–1.030)
Specific Gravity, Urine: 1.01 (ref 1.005–1.030)
pH: 6 (ref 5.0–8.0)
pH: 6 (ref 5.0–8.0)

## 2016-06-19 LAB — POCT PREGNANCY, URINE: PREG TEST UR: NEGATIVE

## 2016-06-19 MED ORDER — PHENAZOPYRIDINE HCL 200 MG PO TABS
200.0000 mg | ORAL_TABLET | Freq: Three times a day (TID) | ORAL | 1 refills | Status: DC | PRN
Start: 2016-06-19 — End: 2016-11-14

## 2016-06-19 MED ORDER — PHENAZOPYRIDINE HCL 100 MG PO TABS
200.0000 mg | ORAL_TABLET | Freq: Once | ORAL | Status: AC
  Administered 2016-06-19: 200 mg via ORAL
  Filled 2016-06-19: qty 2

## 2016-06-19 NOTE — MAU Note (Signed)
Pt reports she has been having sharp pains right below her umbilicus. States she has been having difficulty urinating all day. States she did empty her bladder right before here but she felt she had to force it.

## 2016-06-19 NOTE — MAU Provider Note (Signed)
Chief Complaint: No chief complaint on file.   None     SUBJECTIVE HPI: Allison Shaw is a 20 y.o. G1P1001 s/p vacuum assisted vaginal delivery one month ago who presents to maternity admissions reporting urinary retention. She reports this has been a problem since her delivery but she is able to void daily, especially when taking pyridium. She ran out of the medication and has not taken any for several days, then yesterday reported difficulty, needing to strain to empty urine, and today only urinating a few drops despite drinking lots of water. She reports feeling pressure in her low abdomen that is constant but occasional sharp shooting pain in her low abdomen, similar to the pain she had on 10/13 when she returned to the hospital with urinary symptoms. At that visit, she had a foley catheter placed that was removed in the office 4 days later. She was able to void in the office after the catheter was removed. She reports light lochia, denies cramping, vaginal itching/burning, dysuria, h/a, dizziness, n/v, or fever/chills.     HPI  Past Medical History:  Diagnosis Date  . ADHD (attention deficit hyperactivity disorder)   . Bipolar 1 disorder (HCC)   . Depression   . Mental disorder    Past Surgical History:  Procedure Laterality Date  . 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 Marital status: Single    Spouse name: N/A  . Number of children: N/A  . Years of education: N/A   Occupational History  . Not on file.   Social History Main Topics  . Smoking status: Never Smoker  . Smokeless tobacco: Never Used  . Alcohol use No  . Drug use: No  . Sexual activity: Yes   Other Topics Concern  . Not on file   Social History Narrative  . No narrative on file   No current facility-administered medications on file prior to encounter.    Current Outpatient Prescriptions on File Prior to Encounter  Medication Sig Dispense Refill  .  docusate sodium (COLACE) 100 MG capsule Take 100 mg by mouth daily.    Marland Kitchen. ibuprofen (ADVIL,MOTRIN) 600 MG tablet Take 1 tablet (600 mg total) by mouth every 6 (six) hours as needed. 30 tablet 0  . Prenatal Vit-Fe Fumarate-FA (PRENATAL MULTIVITAMIN) TABS tablet Take 1 tablet by mouth at bedtime.     Allergies  Allergen Reactions  . Latex Itching and Other (See Comments)    Reaction:  Burning   . Fish Allergy Rash    ROS:  Review of Systems  Constitutional: Negative for chills, fatigue and fever.  Respiratory: Negative for shortness of breath.   Cardiovascular: Negative for chest pain.  Genitourinary: Positive for difficulty urinating, pelvic pain and vaginal bleeding. Negative for dysuria, flank pain, vaginal discharge and vaginal pain.  Neurological: Negative for dizziness and headaches.  Psychiatric/Behavioral: Negative.      I have reviewed patient's Past Medical Hx, Surgical Hx, Family Hx, Social Hx, medications and allergies.   Physical Exam   Patient Vitals for the past 24 hrs:  BP Temp Temp src Pulse Resp SpO2 Height Weight  06/19/16 2204 110/61 98.2 F (36.8 C) Oral 66 17 - - -  06/19/16 2015 117/71 98 F (36.7 C) Oral 70 16 97 % 5\' 3"  (1.6 m) 133 lb (60.3 kg)   Constitutional: Well-developed, well-nourished female in no acute distress.  Cardiovascular: normal rate Respiratory: normal effort GI: Abd  soft, suprapubic tenderness only, slight suprapubic distention. Pos BS x 4 MS: Extremities nontender, no edema, normal ROM Neurologic: Alert and oriented x 4.  GU: Neg CVAT.  Bladder scan shows 500 cc in bladder  PELVIC EXAM: Deferred   LAB RESULTS Results for orders placed or performed during the hospital encounter of 06/19/16 (from the past 24 hour(s))  Urinalysis, Routine w reflex microscopic (not at [facility])     Status: Abnormal   Collection Time: 06/19/16  8:16 PM  Result Value Ref Range   Color, Urine YELLOW YELLOW   APPearance TURBID (A) CLEAR   Specific  Gravity, Urine 1.015 1.005 - 1.030   pH 6.0 5.0 - 8.0   Glucose, UA NEGATIVE NEGATIVE mg/dL   Hgb urine dipstick LARGE (A) NEGATIVE   Bilirubin Urine NEGATIVE NEGATIVE   Ketones, ur NEGATIVE NEGATIVE mg/dL   Protein, ur NEGATIVE NEGATIVE mg/dL   Nitrite NEGATIVE NEGATIVE   Leukocytes, UA MODERATE (A) NEGATIVE  Urine microscopic-add on     Status: Abnormal   Collection Time: 06/19/16  8:16 PM  Result Value Ref Range   Squamous Epithelial / LPF 6-30 (A) NONE SEEN   WBC, UA 6-30 0 - 5 WBC/hpf   RBC / HPF TOO NUMEROUS TO COUNT 0 - 5 RBC/hpf   Bacteria, UA MANY (A) NONE SEEN  Pregnancy, urine POC     Status: None   Collection Time: 06/19/16  8:30 PM  Result Value Ref Range   Preg Test, Ur NEGATIVE NEGATIVE  Urinalysis, Routine w reflex microscopic (not at [facility])     Status: Abnormal   Collection Time: 06/19/16  9:50 PM  Result Value Ref Range   Color, Urine YELLOW YELLOW   APPearance CLEAR CLEAR   Specific Gravity, Urine 1.010 1.005 - 1.030   pH 6.0 5.0 - 8.0   Glucose, UA NEGATIVE NEGATIVE mg/dL   Hgb urine dipstick TRACE (A) NEGATIVE   Bilirubin Urine NEGATIVE NEGATIVE   Ketones, ur NEGATIVE NEGATIVE mg/dL   Protein, ur NEGATIVE NEGATIVE mg/dL   Nitrite NEGATIVE NEGATIVE   Leukocytes, UA NEGATIVE NEGATIVE  Urine microscopic-add on     Status: Abnormal   Collection Time: 06/19/16  9:50 PM  Result Value Ref Range   Squamous Epithelial / LPF 0-5 (A) NONE SEEN   WBC, UA 0-5 0 - 5 WBC/hpf   RBC / HPF 0-5 0 - 5 RBC/hpf   Bacteria, UA MANY (A) NONE SEEN    --/--/A POS, A POS (10/04 1010)  IMAGING No results found.  MAU Management/MDM: Ordered labs and reviewed results.  Urinary retention, reduced but never resolved since delivery, worse without pyridum in last few days.  Consult Dr Alysia Penna. Place foley catheter and consult urology.  Consulted urologist on call, Dr Mena Goes.  Per Dr Mena Goes pt to follow up in office for catheter removal in 5 days.  Pyridium Rx sent to pharmacy.   Treatments in MAU included Pyridium 200 mg x 1 dose and placement of foley catheter.  Urine from catheter sent for culture. Pt stable at time of discharge.  ASSESSMENT 1. Urinary retention   2. Dysuria     PLAN Discharge home Pyridium Rx    Medication List    TAKE these medications   docusate sodium 100 MG capsule Commonly known as:  COLACE Take 100 mg by mouth daily.   ibuprofen 600 MG tablet Commonly known as:  ADVIL,MOTRIN Take 1 tablet (600 mg total) by mouth every 6 (six) hours as needed.  phenazopyridine 200 MG tablet Commonly known as:  PYRIDIUM Take 1 tablet (200 mg total) by mouth 3 (three) times daily as needed for pain (urethral spasm).   prenatal multivitamin Tabs tablet Take 1 tablet by mouth at bedtime.      Follow-up Information    [facility] Follow up.   Contact information: [address]  [place] [address] [place] [id number] [phone]        [facility].   Specialty:  Obstetrics and Gynecology Why:  As planned for postpartum visit, return to MAU as needed for emergencies Contact information: [address] [place] [id number] [phone]          Sharen CounterLisa Leftwich-Kirby Certified Nurse-Midwife 06/19/2016  11:50 PM

## 2016-06-19 NOTE — Discharge Instructions (Signed)
Acute Urinary Retention, Female °Acute urinary retention is the temporary inability to urinate. This is an uncommon problem in women. It can be caused by: °· Infection. °· A side effect of a medicine. °· A problem in a nearby organ that presses or squeezes on the bladder or the urethra (the tube that drains the bladder). °· Psychological problems. °·  Surgery on your bladder, urethra, or pelvic organs that causes obstruction to the outflow of urine from your bladder. °HOME CARE INSTRUCTIONS  °If you are sent home with a Foley catheter and a drainage system, you will need to discuss the best course of action with your health care provider. While the catheter is in, maintain a good intake of fluids. Keep the drainage bag emptied and lower than your catheter. This is so that contaminated urine will not flow back into your bladder, which could lead to a urinary tract infection. °There are two main types of drainage bags. One is a large bag that usually is used at night. It has a good capacity that will allow you to sleep through the night without having to empty it. The second type is called a leg bag. It has a smaller capacity so it needs to be emptied more frequently. However, the main advantage is that it can be attached by a leg strap and goes underneath your clothing, allowing you the freedom to move about or leave your home. °Only take over-the-counter or prescription medicines for pain, discomfort, or fever as directed by your health care provider.  °SEEK MEDICAL CARE IF: °· You develop a low-grade fever. °· You experience spasms or leakage of urine with the spasms. °SEEK IMMEDIATE MEDICAL CARE IF:  °· You develop chills or fever. °· Your catheter stops draining urine. °· Your catheter falls out. °· You start to develop increased bleeding that does not respond to rest and increased fluid intake. °MAKE SURE YOU: °· Understand these instructions. °· Will watch your condition. °· Will get help right away if you are  not doing well or get worse. °  °This information is not intended to replace advice given to you by your health care provider. Make sure you discuss any questions you have with your health care provider. °  °Document Released: 07/31/2006 Document Revised: 12/16/2014 Document Reviewed: 01/10/2013 °Elsevier Interactive Patient Education ©2016 [employer] ° °

## 2016-06-21 ENCOUNTER — Inpatient Hospital Stay (HOSPITAL_COMMUNITY)
Admission: AD | Admit: 2016-06-21 | Discharge: 2016-06-21 | Disposition: A | Discharge status: Home / Self Care | Payer: Medicaid Other | Source: Ambulatory Visit | Attending: Family Medicine | Admitting: Family Medicine

## 2016-06-21 ENCOUNTER — Inpatient Hospital Stay (HOSPITAL_COMMUNITY): Payer: Medicaid Other

## 2016-06-21 ENCOUNTER — Encounter (HOSPITAL_COMMUNITY): Payer: Self-pay

## 2016-06-21 DIAGNOSIS — R1030 Lower abdominal pain, unspecified: Secondary | ICD-10-CM | POA: Diagnosis present

## 2016-06-21 DIAGNOSIS — N390 Urinary tract infection, site not specified: Secondary | ICD-10-CM

## 2016-06-21 DIAGNOSIS — T83511D Infection and inflammatory reaction due to indwelling urethral catheter, subsequent encounter: Secondary | ICD-10-CM

## 2016-06-21 DIAGNOSIS — O8620 Urinary tract infection following delivery, unspecified: Secondary | ICD-10-CM | POA: Diagnosis not present

## 2016-06-21 DIAGNOSIS — R339 Retention of urine, unspecified: Secondary | ICD-10-CM

## 2016-06-21 DIAGNOSIS — R109 Unspecified abdominal pain: Secondary | ICD-10-CM

## 2016-06-21 LAB — URINALYSIS, ROUTINE W REFLEX MICROSCOPIC
BILIRUBIN URINE: NEGATIVE
Glucose, UA: 100 mg/dL — AB
KETONES UR: 15 mg/dL — AB
LEUKOCYTES UA: NEGATIVE
NITRITE: POSITIVE — AB
PROTEIN: 100 mg/dL — AB
Specific Gravity, Urine: 1.025 (ref 1.005–1.030)
pH: 5 (ref 5.0–8.0)

## 2016-06-21 LAB — CBC
HCT: 37.1 % (ref 36.0–46.0)
HEMOGLOBIN: 12 g/dL (ref 12.0–15.0)
MCH: 27.5 pg (ref 26.0–34.0)
MCHC: 32.3 g/dL (ref 30.0–36.0)
MCV: 85.1 fL (ref 78.0–100.0)
PLATELETS: 252 10*3/uL (ref 150–400)
RBC: 4.36 MIL/uL (ref 3.87–5.11)
RDW: 14.6 % (ref 11.5–15.5)
WBC: 5.2 10*3/uL (ref 4.0–10.5)

## 2016-06-21 LAB — URINE MICROSCOPIC-ADD ON

## 2016-06-21 LAB — URINE CULTURE: CULTURE: NO GROWTH

## 2016-06-21 LAB — POCT PREGNANCY, URINE: Preg Test, Ur: NEGATIVE

## 2016-06-21 MED ORDER — CIPROFLOXACIN HCL 500 MG PO TABS: 500.0000 mg | ORAL_TABLET | Freq: Two times a day (BID) | ORAL | 0 refills | Status: DC

## 2016-06-21 MED ORDER — IOPAMIDOL (ISOVUE-300) INJECTION 61%
30.0000 mL | Freq: Once | INTRAVENOUS | Status: AC | PRN
  Administered 2016-06-21: 30 mL

## 2016-06-21 MED ORDER — IOPAMIDOL (ISOVUE-300) INJECTION 61%
100.0000 mL | Freq: Once | INTRAVENOUS | Status: AC | PRN
  Administered 2016-06-21: 100 mL via INTRAVENOUS

## 2016-06-21 NOTE — MAU Provider Note (Signed)
History     CSN: [id number]  Arrival date and time: 06/21/16 1239   None     No chief complaint on file.  G1P1 postpartum 1 month after VAVD here with lower abdominal pain since 2 weeks ago. She reports pain worsened last night. She describes as intermittent and stabbing. She has used Ibuprofen with no relief. She has been seen recently for this pain and urinary retention and Foley cath was placed. She is scheduled to see Urology in 3 days. She denies hematuria. She has minimal lochia. She is breast and formula feeding. She denies fever and chills. She reports onset of lower back pain since arrival.      OB History    Gravida Para Term Preterm AB Living   1 1 1     1    SAB TAB Ectopic Multiple Live Births         0 1      Past Medical History:  Diagnosis Date  . ADHD (attention deficit hyperactivity disorder)   . Bipolar 1 disorder (HCC)   . Depression   . Mental disorder     Past Surgical History:  Procedure Laterality Date  . 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 Mental illness Mother   . Depression Mother     Social History  Substance Use Topics  . Smoking status: Never Smoker  . Smokeless tobacco: Never Used  . Alcohol use No    Allergies:  Allergies  Allergen Reactions  . Latex Itching and Other (See Comments)    Reaction:  Burning   . Fish Allergy Rash    Prescriptions Prior to Admission  Medication Sig Dispense Refill Last Dose  . ibuprofen (ADVIL,MOTRIN) 600 MG tablet Take 1 tablet (600 mg total) by mouth every 6 (six) hours as needed. (Patient taking differently: Take 600 mg by mouth every 6 (six) hours as needed for headache or mild pain. ) 30 tablet 0 06/20/2016 at Unknown time  . phenazopyridine (PYRIDIUM) 200 MG tablet Take 1 tablet (200 mg total) by mouth 3 (three) times daily as needed for pain (urethral spasm). 30 tablet 1 06/21/2016 at Unknown time  . docusate sodium (COLACE)  100 MG capsule Take 100 mg by mouth daily.   06/19/2016  . Prenatal Vit-Fe Fumarate-FA (PRENATAL MULTIVITAMIN) TABS tablet Take 1 tablet by mouth at bedtime.   06/14/2016    Review of Systems  Constitutional: Negative.   Gastrointestinal: Positive for abdominal pain.   Physical Exam   Blood pressure 105/70, pulse 94, temperature 98.5 F (36.9 C), temperature source Oral, resp. rate 18, unknown if currently breastfeeding.  Physical Exam  Constitutional: She is oriented to person, place, and time. She appears well-developed and well-nourished. No distress.  HENT:  Head: Normocephalic and atraumatic.  Neck: Normal range of motion. Neck supple.  Cardiovascular: Normal rate.   Respiratory: Effort normal.  GI: Soft. She exhibits no mass. There is tenderness in the suprapubic area. There is no rigidity, no rebound and no guarding.  Genitourinary:  Genitourinary Comments: External: no lesions Vagina: rugated, parous, small dark bloody discharge Uterus: non enlarged, anteverted, non tender, no CMT Adnexae: no masses, no tenderness left, no tenderness right Foley cath in place, gross hematuria noted in tubing  Musculoskeletal: Normal range of motion.  Neurological: She is alert and oriented to person, place, and time.  Skin: Skin is warm and dry.  Psychiatric: She  has a normal mood and affect.   Results for orders placed or performed during the hospital encounter of 06/21/16 (from the past 24 hour(s))  Urinalysis, Routine w reflex microscopic (not [facility])     Status: Abnormal   Collection Time: 06/21/16  2:21 PM  Result Value Ref Range   Color, Urine ORANGE (A) YELLOW   APPearance CLEAR CLEAR   Specific Gravity, Urine 1.025 1.005 - 1.030   pH 5.0 5.0 - 8.0   Glucose, UA 100 (A) NEGATIVE mg/dL   Hgb urine dipstick LARGE (A) NEGATIVE   Bilirubin Urine NEGATIVE NEGATIVE   Ketones, ur 15 (A) NEGATIVE mg/dL   Protein, ur 161100 (A) NEGATIVE mg/dL   Nitrite POSITIVE (A) NEGATIVE    Leukocytes, UA NEGATIVE NEGATIVE  Urine microscopic-add on     Status: Abnormal   Collection Time: 06/21/16  2:21 PM  Result Value Ref Range   Squamous Epithelial / LPF 0-5 (A) NONE SEEN   WBC, UA 0-5 0 - 5 WBC/hpf   RBC / HPF 6-30 0 - 5 RBC/hpf   Bacteria, UA MANY (A) NONE SEEN  CBC     Status: None   Collection Time: 06/21/16  2:47 PM  Result Value Ref Range   WBC 5.2 4.0 - 10.5 K/uL   RBC 4.36 3.87 - 5.11 MIL/uL   Hemoglobin 12.0 12.0 - 15.0 g/dL   HCT 09.637.1 04.536.0 - 40.946.0 %   MCV 85.1 78.0 - 100.0 fL   MCH 27.5 26.0 - 34.0 pg   MCHC 32.3 30.0 - 36.0 g/dL   RDW 81.114.6 91.411.5 - 78.215.5 %   Platelets 252 150 - 400 K/uL  Pregnancy, urine POC     Status: None   Collection Time: 06/21/16  2:58 PM  Result Value Ref Range   Preg Test, Ur NEGATIVE NEGATIVE    MAU Course  Procedures  MDM Labs and CT ordered.  1612-Report and transfer of care given to AlbionPoe, CNM. Donette LarryMelanie Bhambri, CNM  06/21/2016 4:13 PM   Ct Abdomen Pelvis W Contrast  Result Date: 06/21/2016 CLINICAL DATA:  Postpartum vaginal delivery 1 month ago now with pelvic pain at the umbilicus and urinary retention EXAM: CT ABDOMEN AND PELVIS WITH CONTRAST TECHNIQUE: Multidetector CT imaging of the abdomen and pelvis was performed using the standard protocol following bolus administration of intravenous contrast. CONTRAST:  100mL ISOVUE-300 IOPAMIDOL (ISOVUE-300) INJECTION 61% COMPARISON:  None. FINDINGS: Lower chest:  Unremarkable. Hepatobiliary: No focal abnormality within the liver parenchyma. There is no evidence for gallstones, gallbladder wall thickening, or pericholecystic fluid. No intrahepatic or extrahepatic biliary dilation. Pancreas: No focal mass lesion. No dilatation of the main duct. No intraparenchymal cyst. No peripancreatic edema. Spleen: No splenomegaly. No focal mass lesion. Adrenals/Urinary Tract: No adrenal nodule or mass. Kidneys are unremarkable. No evidence for hydroureter. Urinary bladder is mildly distended despite  the presence of a Foley catheter. Tiny amount of free gas in the bladder lumen presumably secondary to the instrumentation. There is irregular left-sided bladder wall thickening with heterogeneous enhancement. There is a small amount of perivesicular edema seen anteriorly and to the left. Stomach/Bowel: Stomach is nondistended. No gastric wall thickening. No evidence of outlet obstruction. Duodenum is normally positioned as is the ligament of Treitz. No small bowel wall thickening. No small bowel dilatation. The terminal ileum is normal. The appendix is normal. No gross colonic mass. No colonic wall thickening. No substantial diverticular change. Vascular/Lymphatic: No abdominal aortic aneurysm. No abdominal aortic atherosclerotic calcification. There is no  gastrohepatic or hepatoduodenal ligament lymphadenopathy. No intraperitoneal or retroperitoneal lymphadenopathy. No pelvic sidewall lymphadenopathy. Reproductive: Uterus is unremarkable.  There is no adnexal mass. Other: No intraperitoneal free fluid. Musculoskeletal: Bone windows reveal no worrisome lytic or sclerotic osseous lesions. IMPRESSION: 1. Irregular bladder wall thickening demonstrates irregular enhancement, mainly involving the left and anterior walls of the bladder. These changes are associated with mild perivesicular edema. Bladder infection would be a distinct consideration. Do a 2. Mild bladder distention despite the presence of a Foley catheter. This may be due to the catheter being clamped although assessment for bladder dysfunction suggested. Tiny amount of free gas in the bladder lumen presumably secondary to the instrumentation. Electronically Signed   By: Kennith CenterEric  Mansell M.D.   On: 06/21/2016 16:34   Patient reporting suprapubic pain and urinary urgency. Foley to straight drainage> 500cc in bag   Assessment and Plan   1. Urinary tract infection associated with indwelling urethral catheter, subsequent encounter   2. Urinary retention    3. Postpartum state   4. Abdominal pain      Medication List    STOP taking these medications   ibuprofen 600 MG tablet Commonly known as:  ADVIL,MOTRIN     TAKE these medications   ciprofloxacin 500 MG tablet Commonly known as:  CIPRO Take 1 tablet (500 mg total) by mouth 2 (two) times daily.   docusate sodium 100 MG capsule Commonly known as:  COLACE Take 100 mg by mouth daily.   phenazopyridine 200 MG tablet Commonly known as:  PYRIDIUM Take 1 tablet (200 mg total) by mouth 3 (three) times daily as needed for pain (urethral spasm).   prenatal multivitamin Tabs tablet Take 1 tablet by mouth at bedtime.      Follow-up Information    [facility] Follow up on 06/27/2016.   Specialty:  Obstetrics and Gynecology Why:  Keep your Urology appointment on 06/24/16 Contact information: [address] [place] [id number] [phone]

## 2016-06-21 NOTE — Discharge Instructions (Signed)

## 2016-06-21 NOTE — MAU Note (Signed)
Was in here Sunday.  Was having problems with retained urine.  Catheter was placed.  Was set up with a urology appt on Friday.  Is having a lot of pain in lower abd. Thought she might have a UTI, but was not put on antibiotics. Delivered 10/5

## 2016-06-22 LAB — URINE CULTURE: CULTURE: NO GROWTH

## 2016-06-27 ENCOUNTER — Encounter: Payer: Self-pay | Admitting: Family Medicine

## 2016-06-27 ENCOUNTER — Ambulatory Visit (INDEPENDENT_AMBULATORY_CARE_PROVIDER_SITE_OTHER): Payer: Medicaid Other | Admitting: Family Medicine

## 2016-06-27 DIAGNOSIS — Z3202 Encounter for pregnancy test, result negative: Secondary | ICD-10-CM

## 2016-06-27 DIAGNOSIS — R339 Retention of urine, unspecified: Secondary | ICD-10-CM

## 2016-06-27 DIAGNOSIS — Z30013 Encounter for initial prescription of injectable contraceptive: Secondary | ICD-10-CM | POA: Diagnosis not present

## 2016-06-27 LAB — POCT PREGNANCY, URINE: PREG TEST UR: NEGATIVE

## 2016-06-27 MED ORDER — MEDROXYPROGESTERONE ACETATE 150 MG/ML IM SUSP
150.0000 mg | Freq: Once | INTRAMUSCULAR | Status: AC
  Administered 2016-06-27: 150 mg via INTRAMUSCULAR

## 2016-06-27 NOTE — Patient Instructions (Signed)
Postpartum Depression and Baby Blues °The postpartum period begins right after the birth of a baby. During this time, there is often a great amount of joy and excitement. It is also a time of many changes in the life of the parents. Regardless of how many times a mother gives birth, each child brings new challenges and dynamics to the family. It is not unusual to have feelings of excitement along with confusing shifts in moods, emotions, and thoughts. All mothers are at risk of developing postpartum depression or the "baby blues." These mood changes can occur right after giving birth, or they may occur many months after giving birth. The baby blues or postpartum depression can be mild or severe. Additionally, postpartum depression can go away rather quickly, or it can be a long-term condition.  °CAUSES °Raised hormone levels and the rapid drop in those levels are thought to be a main cause of postpartum depression and the baby blues. A number of hormones change during and after pregnancy. Estrogen and progesterone usually decrease right after the delivery of your baby. The levels of thyroid hormone and various cortisol steroids also rapidly drop. Other factors that play a role in these mood changes include major life events and genetics.  °RISK FACTORS °If you have any of the following risks for the baby blues or postpartum depression, know what symptoms to watch out for during the postpartum period. Risk factors that may increase the likelihood of getting the baby blues or postpartum depression include: °· Having a personal or family history of depression.   °· Having depression while being pregnant.   °· Having premenstrual mood issues or mood issues related to oral contraceptives. °· Having a lot of life stress.   °· Having marital conflict.   °· Lacking a social support network.   °· Having a baby with special needs.   °· Having health problems, such as diabetes.   °SIGNS AND SYMPTOMS °Symptoms of baby blues  include: °· Brief changes in mood, such as going from extreme happiness to sadness. °· Decreased concentration.   °· Difficulty sleeping.   °· Crying spells, tearfulness.   °· Irritability.   °· Anxiety.   °Symptoms of postpartum depression typically begin within the first month after giving birth. These symptoms include: °· Difficulty sleeping or excessive sleepiness.   °· Marked weight loss.   °· Agitation.   °· Feelings of worthlessness.   °· Lack of interest in activity or food.   °Postpartum psychosis is a very serious condition and can be dangerous. Fortunately, it is rare. Displaying any of the following symptoms is cause for immediate medical attention. Symptoms of postpartum psychosis include:  °· Hallucinations and delusions.   °· Bizarre or disorganized behavior.   °· Confusion or disorientation.   °DIAGNOSIS  °A diagnosis is made by an evaluation of your symptoms. There are no medical or lab tests that lead to a diagnosis, but there are various questionnaires that a health care provider may use to identify those with the baby blues, postpartum depression, or psychosis. Often, a screening tool called the Edinburgh Postnatal Depression Scale is used to diagnose depression in the postpartum period.  °TREATMENT °The baby blues usually goes away on its own in 1-2 weeks. Social support is often all that is needed. You will be encouraged to get adequate sleep and rest. Occasionally, you may be given medicines to help you sleep.  °Postpartum depression requires treatment because it can last several months or longer if it is not treated. Treatment may include individual or group therapy, medicine, or both to address any social, physiological, and psychological   factors that may play a role in the depression. Regular exercise, a healthy diet, rest, and social support may also be strongly recommended.  °Postpartum psychosis is more serious and needs treatment right away. Hospitalization is often needed. °HOME CARE  INSTRUCTIONS °· Get as much rest as you can. Nap when the baby sleeps.   °· Exercise regularly. Some women find yoga and walking to be beneficial.   °· Eat a balanced and nourishing diet.   °· Do little things that you enjoy. Have a cup of tea, take a bubble bath, read your favorite magazine, or listen to your favorite music. °· Avoid alcohol.   °· Ask for help with household chores, cooking, grocery shopping, or running errands as needed. Do not try to do everything.   °· Talk to people close to you about how you are feeling. Get support from your partner, family members, friends, or other new moms. °· Try to stay positive in how you think. Think about the things you are grateful for.   °· Do not spend a lot of time alone.   °· Only take over-the-counter or prescription medicine as directed by your health care provider. °· Keep all your postpartum appointments.   °· Let your health care provider know if you have any concerns.   °SEEK MEDICAL CARE IF: °You are having a reaction to or problems with your medicine. °SEEK IMMEDIATE MEDICAL CARE IF: °· You have suicidal feelings.   °· You think you may harm the baby or someone else. °MAKE SURE YOU: °· Understand these instructions. °· Will watch your condition. °· Will get help right away if you are not doing well or get worse. °  °This information is not intended to replace advice given to you by your health care provider. Make sure you discuss any questions you have with your health care provider. °  °Document Released: 05/05/2004 Document Revised: 08/06/2013 Document Reviewed: 05/13/2013 °Elsevier Interactive Patient Education ©2016 Elsevier Inc. ° °

## 2016-06-27 NOTE — Progress Notes (Signed)
Subjective:     Philippa ChesterBrittney O'Guin is a 20 y.o. female who presents for a postpartum visit. She is 5 weeks postpartum following a spontaneous vaginal delivery. I have fully reviewed the prenatal and intrapartum course. The delivery was at 1341 2/[redacted] weeks gestational age. Outcome: spontaneous vaginal delivery. Anesthesia: epidural. Postpartum course has been complicated with urinary retention, seeing urology, next visit is 07/25/16. Baby's course has been uncomplicated. Baby is feeding by bottle - Similac Sensitive RS. Bleeding no bleeding. Bowel function is normal. Bladder function is abnormal: being treated for bladder infection.  Being treated by a urologist , has urinary retention. Currently on antibiotics and pyridium, self-catheterizing at home, now only retaining 50-175cc after urinating (improving, basically normal). . Patient is not sexually active. Contraception method is Depo-Provera injections. Postpartum depression screening: negative, admits to some depression a couple weeks ago due to home events with [name] losing jobs, grandfather-in-law in hospital, father-in-law had surgery, now is coping better.   The following portions of the patient's history were reviewed and updated as appropriate: allergies, current medications, past family history, past medical history, past social history, past surgical history and problem list.  Review of Systems Pertinent items are noted in HPI.   Objective:    BP 116/60   Pulse 91   Wt 130 lb 8 oz (59.2 kg)   BMI 23.12 kg/m   General:  alert, cooperative, appears stated age and no distress   Breasts:  inspection negative, no nipple discharge or bleeding, no masses or nodularity palpable  Lungs: clear to auscultation bilaterally  Heart:  regular rate and rhythm, S1, S2 normal, no murmur, click, rub or gallop  Abdomen: soft, non-tender; bowel sounds normal; no masses,  no organomegaly   Vulva:  normal  Vagina: normal vagina, no discharge, exudate, lesion, or  erythema. Healed 2nd degree laceration.  Cervix:  Did not perform  Corpus: not examined  Adnexa:  not evaluated  Rectal Exam: Not performed.        Assessment:    5 week  postpartum exam.  Pap smear not indicated yet (<21 y/o). Urinary retention  Plan:    1. Contraception: Depo-Provera injections 2. Urinary retention is being followed by urology. Improving symptoms. Still taking antibiotics and pyridium. Self cathing. 3. Follow up in: 3 months for Depo or as needed.

## 2016-07-19 ENCOUNTER — Encounter: Payer: Self-pay | Admitting: Family Medicine

## 2016-07-19 ENCOUNTER — Ambulatory Visit: Payer: Medicaid Other | Admitting: Family Medicine

## 2016-07-19 ENCOUNTER — Ambulatory Visit (INDEPENDENT_AMBULATORY_CARE_PROVIDER_SITE_OTHER): Payer: Medicaid Other | Admitting: Clinical

## 2016-07-19 DIAGNOSIS — O99345 Other mental disorders complicating the puerperium: Principal | ICD-10-CM

## 2016-07-19 DIAGNOSIS — F3170 Bipolar disorder, currently in remission, most recent episode unspecified: Secondary | ICD-10-CM | POA: Diagnosis not present

## 2016-07-19 DIAGNOSIS — F53 Postpartum depression: Secondary | ICD-10-CM

## 2016-07-19 NOTE — Progress Notes (Signed)
Patient was need seen by MD today, was not needed.  Cleda ClarksElizabeth W. Mumaw, DO  OB Fellow [facility], [facility]

## 2016-07-19 NOTE — BH Specialist Note (Signed)
Session Start time: 4:45   End Time: 5:10 Total Time:  25 minutes Type of Service: Behavioral Health - Individual/Family Interpreter: No.   Interpreter Name & Language: n/a # [facility]   SUBJECTIVE: Allison Shaw is a 20 y.o. female  Pt. was referred by Dr Omer JackMumaw for:  depression. Pt. reports the following symptoms/concerns: Pt states that her last therapist passed away in [place], and she has not established care with therapy/psychiatry in [place] in past year, and uncertain about starting BH medications today.Pt denies any specific issues today, though admits some depression and panic attacks in the past. Duration of problem:  Undetermined Severity: mild Previous treatment: In [place], one year prior   OBJECTIVE: Mood: Appropriate & Affect: Appropriate Risk of harm to self or others: No known risk of harm to self or others Assessments administered: None today  LIFE CONTEXT:  Family & Social: [name] lives local and supportive  School/ Work: undetermined Self-Care: No issues with sleeping or eating; baby sleeping through the night since week two Life changes: Recent childbirth What is important to pt/family (values): Baby and family   GOALS ADDRESSED:  -Maintaining reduction in symptoms of anxiety and depression -[facility] resources  INTERVENTIONS: DBT, Strength-based and Supportive   ASSESSMENT:  Pt currently experiencing Bipolar affective disorder in remission.  Pt may benefit from psychoeducation and brief therapeutic intervention regarding maintaining reduction in symptoms of anxiety and depression.   PLAN: 1. F/U with behavioral health clinician: As needed 2. Behavioral Health meds: none 3. Behavioral recommendations:  -Consider reading educational material regarding coping with symptoms of anxiety(with panic attacks) and depression -Establish psychiat[facility] at either [facility] [place] or [facility] [place] at  either agency's walk-in hours, as discussed in office visit -Consider [facility] as additional community resource Prior to any mood changes 4. Referral: Brief Counseling/Psychotherapy, [facility] Resource and Psychoeducation 5. From scale of 1-10, how likely are you to follow plan: Undetermined   Gaynell FaceJamie C Mcmannes LCSWA Behavioral Health Clinician  Marlon PelWarmhandoff:   Warm Hand Off Completed.        Depression screen [facility] 2[facility] 06/27/2016 05/04/2016 04/06/2016  Decreased Interest 0 0 0  Down, Depressed, Hopeless 1 0 0  PHQ - 2 Score 1 0 0  Altered sleeping 0 1 0  Tired, decreased energy 0 1 1  Change in appetite 0 0 0  Feeling bad or failure about yourself  0 0 0  Trouble concentrating 0 0 0  Moving slowly or fidgety/restless 0 0 0  Suicidal thoughts 0 0 0  PHQ-9 Score 1 2 1    GAD 7 : Generalized Anxiety Score 06/27/2016 05/04/2016 04/06/2016  Nervous, Anxious, on Edge 0 0 0  Control/stop worrying 0 0 0  Worry too much - different things 0 0 0  Trouble relaxing 0 0 0  Restless 0 0 1  Easily annoyed or irritable 0 0 1  Afraid - awful might happen 0 0 0  Total GAD 7 Score 0 0 2

## 2016-07-19 NOTE — Progress Notes (Signed)
Pt here today to see Asher MuirJamie for depression.  Pt has had appt with Urology and is being managed by the facility.  Pt was seen by Asher MuirJamie.  Pt c/o of irregular bleeding.  Looking in pt's chart, pt had depo on 06/27/16.  Pt stated that she changes a thick pad every two hours since Thursday.  Notified Mumaw, was advised that the Depo Provera can cause irregular bleeding.  I informed pt of provider's recommendation and I also advised pt to wait up to three injections before determining how her bleeding will be.  I also informed pt that if her bleeding gets heavier that she soaking a pad an hour to please go to MAU.   Pt stated understanding with no further questions.

## 2016-09-12 ENCOUNTER — Ambulatory Visit (INDEPENDENT_AMBULATORY_CARE_PROVIDER_SITE_OTHER): Payer: Medicaid Other | Admitting: *Deleted

## 2016-09-12 DIAGNOSIS — Z3042 Encounter for surveillance of injectable contraceptive: Secondary | ICD-10-CM

## 2016-09-12 MED ORDER — MEDROXYPROGESTERONE ACETATE 150 MG/ML IM SUSP
150.0000 mg | Freq: Once | INTRAMUSCULAR | Status: AC
  Administered 2016-09-12: 150 mg via INTRAMUSCULAR

## 2016-10-10 ENCOUNTER — Telehealth: Payer: Self-pay | Admitting: General Practice

## 2016-10-10 NOTE — Telephone Encounter (Signed)
Patient called and left message stating she just received her second depo shot and is still having problems with bleeding. Patient states she would like to know if she can switch to the nexplanon instead. Called patient back she states she had bleeding the whole first three months she was on depo then came in for the second shot, stopped bleeding for 2 weeks then started back again. Patient states she would like to switch to the nexplanon. Discussed the possibility that she could have the same bleeding with the nexplanon or worse and that she will likely have irregular bleeding from changing birth control options as well. Patient verbalized understanding & states she would like to come in for the nexplanon. Told patient I would notify the front office and someone will contact her to set that up. Patient verbalized understanding & had no questions

## 2016-11-14 ENCOUNTER — Encounter (HOSPITAL_COMMUNITY): Payer: Self-pay | Admitting: Emergency Medicine

## 2016-11-14 ENCOUNTER — Emergency Department (HOSPITAL_COMMUNITY)
Admission: EM | Admit: 2016-11-14 | Discharge: 2016-11-14 | Disposition: A | Discharge status: Home / Self Care | Payer: Medicaid Other | Attending: Emergency Medicine | Admitting: Emergency Medicine

## 2016-11-14 ENCOUNTER — Telehealth: Payer: Self-pay | Admitting: Clinical

## 2016-11-14 DIAGNOSIS — Z91040 Latex allergy status: Secondary | ICD-10-CM | POA: Diagnosis not present

## 2016-11-14 DIAGNOSIS — F909 Attention-deficit hyperactivity disorder, unspecified type: Secondary | ICD-10-CM | POA: Diagnosis not present

## 2016-11-14 DIAGNOSIS — Z79899 Other long term (current) drug therapy: Secondary | ICD-10-CM | POA: Diagnosis not present

## 2016-11-14 DIAGNOSIS — F39 Unspecified mood [affective] disorder: Secondary | ICD-10-CM | POA: Insufficient documentation

## 2016-11-14 DIAGNOSIS — F329 Major depressive disorder, single episode, unspecified: Secondary | ICD-10-CM | POA: Diagnosis present

## 2016-11-14 NOTE — Telephone Encounter (Signed)
Allison Shaw agrees to go to [facility] today, for concerning thoughts, but no intention, of self-harm. Pt also agrees to answer her phone at [phone] on 4/3/018, when she receives a call from [facility], to confirm that she has gone to the ED, for [facility] evaluation and to begin [facility] medications. Pt agrees to begin taking any BH medications that she is prescribed, and will still attend her WOC appointment with Dr. Debroah Loop on 11/18/2016, along with seeing [facility] same-day, for a brief visit to review her options for psychiatry, if she has not made an appointment yet, upon leaving the hospital.

## 2016-11-14 NOTE — ED Triage Notes (Signed)
Pt c/o exacerbation of depression, states it is the anniversary of her grandmother's death. Thoughts of scratching self with her fingernails. No SI/HI/AVH.

## 2016-11-14 NOTE — ED Provider Notes (Signed)
[facility]-EMERGENCY DEPT Provider Note   CSN: [id number] Arrival date & time: 11/14/16  1412     History   Chief Complaint Chief Complaint  Patient presents with  . Depression    HPI Allison Shaw is a 21 y.o. female.  The history is provided by the patient. No language interpreter was used.  Depression    Allison Shaw is a 21 y.o. female who presents to the Emergency Department complaining of depression.  She reports feeling increased stress and depression. Her grandmother died a year ago and this is close to the anniversary of her death and she feels increased stress related to this. She has lots of scratching herself with her fingernails. She has cut her finger nails short to prevent herself from scratching herself. She denies any SI, HI, hallucinations. She has a 87-month-old baby at home. She states that when she feels stressed she can hold her baby and this improves her stress. She denies any thoughts of harming the baby. She was on Lexapro in the past but was off this medication 2 years ago due to a move. She feels safe at home and has multiple family members that live with her. She denies any alcohol or drug use.  Past Medical History:  Diagnosis Date  . ADHD (attention deficit hyperactivity disorder)   . Bipolar 1 disorder (HCC)   . Depression   . Mental disorder     Patient Active Problem List   Diagnosis Date Noted  . Post-dates pregnancy 05/18/2016  . Bipolar 2 disorder (HCC) 04/20/2016  . Attention deficit hyperactivity disorder (ADHD) 04/20/2016  . Supervision of high risk pregnancy, antepartum 03/24/2016  . Pregnancy complicated by fetal congenital heart disease 03/24/2016    Past Surgical History:  Procedure Laterality Date  . 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 Authorizing Provider  medroxyPROGESTERone (DEPO-PROVERA) 150 MG/ML injection Inject 150 mg into the muscle every 3 (three) months.   Yes Historical Provider, MD    Family History Family History  Problem Relation Age of Onset  . Mental illness Mother   . Depression Mother     Social History Social History  Substance Use Topics  . Smoking status: Never Smoker  . Smokeless tobacco: Never Used  . Alcohol use No     Allergies   Latex and Fish allergy   Review of Systems Review of Systems  Psychiatric/Behavioral: Positive for depression.  All other systems reviewed and are negative.    Physical Exam Updated Vital Signs BP (!) 107/59 (BP Location: Right Arm)   Pulse 87   Temp 98.2 F (36.8 C) (Oral)   Resp 14   SpO2 100%   Breastfeeding? No   Physical Exam  Constitutional: She is oriented to person, place, and time. She appears well-developed and well-nourished.  HENT:  Head: Normocephalic and atraumatic.  Cardiovascular: Normal rate and regular rhythm.   No murmur heard. Pulmonary/Chest: Effort normal and breath sounds normal. No respiratory distress.  Abdominal: Soft. There is no tenderness. There is no rebound and no guarding.  Musculoskeletal: She exhibits no edema or tenderness.  Neurological:  She is alert and oriented to person, place, and time.  Skin: Skin is warm and dry.  Psychiatric: She has a normal mood and affect. Her behavior is normal.  Nursing note and vitals reviewed.    ED Treatments / Results  Labs (all labs ordered are listed, but only abnormal results are displayed) Labs Reviewed - No data to display  EKG  EKG Interpretation None       Radiology No results found.  Procedures Procedures (including critical care time)  Medications Ordered in ED Medications - No data to display   Initial Impression / Assessment and Plan / ED Course  I have reviewed the triage vital signs and the  nursing notes.  Pertinent labs & imaging results that were available during my care of the patient were reviewed by me and considered in my medical decision making (see chart for details).    Patient with history of scratching herself and self-harm in the past here for feelings of wanting his scratch herself again. She expresses no SI or HI and has normal thought content. She has a safety plan home. Counseled patient on home care for depression with importance of outpatient follow-up and counseling. Provided resources for counseling in the community. Return precautions discussed for any worsening symptoms.  Final Clinical Impressions(s) / ED Diagnoses   Final diagnoses:  Mood disorder [facility]    New Prescriptions New Prescriptions   No medications on file     Tilden Fossa, MD 11/14/16 1642

## 2016-11-15 ENCOUNTER — Telehealth: Payer: Self-pay | Admitting: *Deleted

## 2016-11-15 NOTE — Telephone Encounter (Signed)
Call center nurse left message on nurse line. Stated has spoken with [place] and is concerned about her depression, hopes to get her to clinic today to be seen otherwise she will send her to the ER. Patient scheduled with Dr Debroah Loop on Friday 4/6.

## 2016-11-15 NOTE — Telephone Encounter (Signed)
Reviewed patient's chart, she spoke with Asher Muir, LCSW by phone yesterday, and went to ER as directed.

## 2016-11-18 ENCOUNTER — Encounter: Payer: Self-pay | Admitting: Obstetrics & Gynecology

## 2016-11-18 ENCOUNTER — Ambulatory Visit (INDEPENDENT_AMBULATORY_CARE_PROVIDER_SITE_OTHER): Payer: Medicaid Other | Admitting: Obstetrics & Gynecology

## 2016-11-18 VITALS — BP 100/66 | HR 92 | Wt 121.7 lb

## 2016-11-18 DIAGNOSIS — Z30017 Encounter for initial prescription of implantable subdermal contraceptive: Secondary | ICD-10-CM

## 2016-11-18 DIAGNOSIS — N921 Excessive and frequent menstruation with irregular cycle: Secondary | ICD-10-CM

## 2016-11-18 DIAGNOSIS — Z3049 Encounter for surveillance of other contraceptives: Secondary | ICD-10-CM

## 2016-11-18 MED ORDER — MEDROXYPROGESTERONE ACETATE 10 MG PO TABS: 20.0000 mg | ORAL_TABLET | Freq: Every day | ORAL | 0 refills | Status: DC

## 2016-11-18 MED ORDER — ETONOGESTREL 68 MG ~~LOC~~ IMPL
68.0000 mg | DRUG_IMPLANT | Freq: Once | SUBCUTANEOUS | Status: AC
  Administered 2016-11-18: 68 mg via SUBCUTANEOUS

## 2016-11-18 NOTE — Patient Instructions (Signed)
Etonogestrel implant What is this medicine? ETONOGESTREL (et oh noe JES trel) is a contraceptive (birth control) device. It is used to prevent pregnancy. It can be used for up to 3 years. This medicine may be used for other purposes; ask your health care provider or pharmacist if you have questions. COMMON BRAND NAME(S): Implanon, Nexplanon What should I tell my health care provider before I take this medicine? They need to know if you have any of these conditions: -abnormal vaginal bleeding -blood vessel disease or blood clots -cancer of the breast, cervix, or liver -depression -diabetes -gallbladder disease -headaches -heart disease or recent heart attack -high blood pressure -high cholesterol -kidney disease -liver disease -renal disease -seizures -tobacco smoker -an unusual or allergic reaction to etonogestrel, other hormones, anesthetics or antiseptics, medicines, foods, dyes, or preservatives -pregnant or trying to get pregnant -breast-feeding How should I use this medicine? This device is inserted just under the skin on the inner side of your upper arm by a health care professional. Talk to your pediatrician regarding the use of this medicine in children. Special care may be needed. Overdosage: If you think you have taken too much of this medicine contact a poison control center or emergency room at once. NOTE: This medicine is only for you. Do not share this medicine with others. What if I miss a dose? This does not apply. What may interact with this medicine? Do not take this medicine with any of the following medications: -amprenavir -bosentan -fosamprenavir This medicine may also interact with the following medications: -barbiturate medicines for inducing sleep or treating seizures -certain medicines for fungal infections like ketoconazole and itraconazole -grapefruit juice -griseofulvin -medicines to treat seizures like carbamazepine, felbamate, oxcarbazepine,  phenytoin, topiramate -modafinil -phenylbutazone -rifampin -rufinamide -some medicines to treat HIV infection like atazanavir, indinavir, lopinavir, nelfinavir, tipranavir, ritonavir -St. John's wort This list may not describe all possible interactions. Give your health care provider a list of all the medicines, herbs, non-prescription drugs, or dietary supplements you use. Also tell them if you smoke, drink alcohol, or use illegal drugs. Some items may interact with your medicine. What should I watch for while using this medicine? This product does not protect you against HIV infection (AIDS) or other sexually transmitted diseases. You should be able to feel the implant by pressing your fingertips over the skin where it was inserted. Contact your doctor if you cannot feel the implant, and use a non-hormonal birth control method (such as condoms) until your doctor confirms that the implant is in place. If you feel that the implant may have broken or become bent while in your arm, contact your healthcare provider. What side effects may I notice from receiving this medicine? Side effects that you should report to your doctor or health care professional as soon as possible: -allergic reactions like skin rash, itching or hives, swelling of the face, lips, or tongue -breast lumps -changes in emotions or moods -depressed mood -heavy or prolonged menstrual bleeding -pain, irritation, swelling, or bruising at the insertion site -scar at site of insertion -signs of infection at the insertion site such as fever, and skin redness, pain or discharge -signs of pregnancy -signs and symptoms of a blood clot such as breathing problems; changes in vision; chest pain; severe, sudden headache; pain, swelling, warmth in the leg; trouble speaking; sudden numbness or weakness of the face, arm or leg -signs and symptoms of liver injury like dark yellow or brown urine; general ill feeling or flu-like symptoms;  light-colored   stools; loss of appetite; nausea; right upper belly pain; unusually weak or tired; yellowing of the eyes or skin -unusual vaginal bleeding, discharge -signs and symptoms of a stroke like changes in vision; confusion; trouble speaking or understanding; severe headaches; sudden numbness or weakness of the face, arm or leg; trouble walking; dizziness; loss of balance or coordination Side effects that usually do not require medical attention (report to your doctor or health care professional if they continue or are bothersome): -acne -back pain -breast pain -changes in weight -dizziness -general ill feeling or flu-like symptoms -headache -irregular menstrual bleeding -nausea -sore throat -vaginal irritation or inflammation This list may not describe all possible side effects. Call your doctor for medical advice about side effects. You may report side effects to FDA at 1-800-FDA-1088. Where should I keep my medicine? This drug is given in a hospital or clinic and will not be stored at home. NOTE: This sheet is a summary. It may not cover all possible information. If you have questions about this medicine, talk to your doctor, pharmacist, or health care provider.  2018 Elsevier/Gold Standard (2016-02-18 11:19:22)  

## 2016-11-18 NOTE — Progress Notes (Signed)
GYNECOLOGY OFFICE PROCEDURE NOTE  Allison Shaw is a 21 y.o. G1P1001 here for Nexplanon insertion.  She has daily breakthrough bleeding with depo provera  Nexplanon Insertion Procedure Patient identified, informed consent performed, consent signed.   Patient does understand that irregular bleeding is a very common side effect of this medication.  Appropriate time out taken.  Patient's left arm was prepped and draped in the usual sterile fashion. The ruler used to measure and mark insertion area.  Patient was prepped with alcohol swab and then injected with 3 ml of 1% lidocaine.  She was prepped with betadine, Nexplanon removed from packaging,  Device confirmed in needle, then inserted full length of needle and withdrawn per handbook instructions. Nexplanon was able to palpated in the patient's arm; patient palpated the insert herself. There was minimal blood loss.  Patient insertion site covered with guaze and a pressure bandage to reduce any bruising.  The patient tolerated the procedure well and was given post procedure instructions.   For BTB I prescribed Provera 20 mg daily 30 days     Adam Phenix, MD Attending Obstetrician & Gynecologist, [facility]

## 2016-11-23 ENCOUNTER — Telehealth: Payer: Self-pay | Admitting: Clinical

## 2016-11-23 NOTE — Telephone Encounter (Signed)
Left HIPPA-compliant message to return call to Jamie from [facility] at [phone]. 

## 2016-11-28 ENCOUNTER — Ambulatory Visit: Payer: Self-pay

## 2017-01-11 ENCOUNTER — Telehealth: Payer: Self-pay | Admitting: *Deleted

## 2017-01-11 DIAGNOSIS — N921 Excessive and frequent menstruation with irregular cycle: Secondary | ICD-10-CM

## 2017-01-11 MED ORDER — MEDROXYPROGESTERONE ACETATE 10 MG PO TABS: 20.0000 mg | ORAL_TABLET | Freq: Every day | ORAL | 0 refills | Status: DC

## 2017-01-11 NOTE — Telephone Encounter (Addendum)
Pt left message yesterday stating that she received birth control implant due to bleeding x6 months while on Depo Provera.  She is still bleeding since implant insertion and wants to know if there is any medication to stop her bleeding because she has been bleeding for 8 months straight. I returned pt's call and discussed her concerns. She stated that when she initially received the implant on 4/6, she was given a medication (Provera) which she took as directed. Once the medication was finished, she started bleeding again. The bleeding is not heavy and she is changing her pad twice daily. I advised pt that while she is certainly frustrated with bleeding for so long, the current amount she is having is consistent with how her body is becoming accustomed to the implant which Dr. Debroah LoopArnold had explained. I will contact Dr. Debroah LoopArnold with this information and let her know when a response has been received. Pt voiced understanding and stated that a detailed message can be left on her voice mail if she does not answer the return call.   1625  Called pt and left message on voice mail that I have had a response form Dr. Debroah LoopArnold. He prescribed 30 days of Provera and I have sent the prescription to her pharmacy. When the medication is completed, she will likely have a regular period.  She should call us back if she has bleeding for more than 7-8 days.

## 2017-03-28 ENCOUNTER — Telehealth: Payer: Self-pay | Admitting: *Deleted

## 2017-03-28 NOTE — Telephone Encounter (Signed)
Pt left message yesterday stating that she has been bleeding for 12 days and wants to know what she should do. She was previously given hormone pills for bleeding by Dr. Debroah LoopArnold. Per chart review, pt was advised on 5/30 to call our office if she should have bleeding which lasts for more than 7-8 days.

## 2017-04-03 NOTE — Telephone Encounter (Signed)
I called Allison Shaw back and she reports she took the provera for the whole bottle and bleeding stopped for about a month.then she got her regular 7 day period and stopped bleeding for about 2 days.States then started bleeding on 8/1 and has been bleeding since- sometimes heavy, sometimes light. States has been anemic since age 21 and her Mom worried she was now so she is taking over the counter iron once daily.  States she is drinking lots of water everyday. I informed her I would send a message to Dr. Debroah Loop since he is not in the office today and we will call her once we hear back. I advised her in the meantime if she has heavy bleeding enough to saturate pad or tampon in one hour or less for more than 2 hours to come to [facility]. I also advised her if she starts feeling lightheaded, dizzy to come to [facility]. She states she occasionally feels that way but not much. I also advised her is she hasn't heard back from [place] in 2 days to call us back. She voices understanding.

## 2017-04-12 ENCOUNTER — Telehealth: Payer: Self-pay | Admitting: General Practice

## 2017-04-12 DIAGNOSIS — N921 Excessive and frequent menstruation with irregular cycle: Secondary | ICD-10-CM

## 2017-04-12 MED ORDER — MEDROXYPROGESTERONE ACETATE 10 MG PO TABS: 20.0000 mg | ORAL_TABLET | Freq: Every day | ORAL | 0 refills | Status: DC

## 2017-04-12 NOTE — Telephone Encounter (Signed)
Patient called and left message stating she called recently and was waiting to hear something back from Dr Debroah Loop. Per Dr Debroah Loop, refill patient's provera and have her come in for follow up appt. Called and informed patient. Patient verbalized understanding and will await phone call from front office. Patient had no questions

## 2017-04-13 ENCOUNTER — Telehealth: Payer: Self-pay | Admitting: General Practice

## 2017-04-13 NOTE — Telephone Encounter (Signed)
Called patient and left message on VM in regards to follow up GYN visit with Dr. Debroah LoopArnold on 05/01/17 at 0820.  Asked patient to give our office a call if unable to keep appointment.

## 2017-05-01 ENCOUNTER — Encounter: Payer: Self-pay | Admitting: Obstetrics & Gynecology

## 2017-05-01 ENCOUNTER — Ambulatory Visit (INDEPENDENT_AMBULATORY_CARE_PROVIDER_SITE_OTHER): Payer: Medicaid Other | Admitting: Obstetrics & Gynecology

## 2017-05-01 DIAGNOSIS — N921 Excessive and frequent menstruation with irregular cycle: Secondary | ICD-10-CM | POA: Diagnosis present

## 2017-05-01 MED ORDER — MEDROXYPROGESTERONE ACETATE 10 MG PO TABS: 20.0000 mg | ORAL_TABLET | Freq: Every day | ORAL | 0 refills | Status: DC

## 2017-05-01 MED ORDER — ESTRADIOL 2 MG PO TABS: 2.0000 mg | ORAL_TABLET | Freq: Every day | ORAL | 1 refills | Status: DC

## 2017-05-01 NOTE — Progress Notes (Signed)
Subjective:     Patient ID: Allison Shaw, female   DOB: [birth year], 21 y.o.   MRN: [id number] Cc frequent spotting with Nexplanon in place  HPI G1P1001 No LMP recorded. Patient has had an implant. She reports frequent spotting with Nexplanon since 11/2016, better since taking Provera daily. She has some cramps. Past Medical History:  Diagnosis Date  . ADHD (attention deficit hyperactivity disorder)   . Bipolar 1 disorder (HCC)   . Depression   . Mental disorder    Allergies  Allergen Reactions  . Latex Itching and Other (See Comments)    Reaction:  Burning   . Fish Allergy Rash      Review of Systems  Constitutional: Negative.   Genitourinary: Positive for menstrual problem, pelvic pain and vaginal bleeding. Negative for dysuria and vaginal discharge.       Objective:   Physical Exam  Constitutional: She appears well-developed. No distress.  Skin: No pallor.  Psychiatric: She has a normal mood and affect. Her behavior is normal.       Assessment:     BTB with Nexplanon    Plan:     Refill her Provera one time and add Estrace 2 mg for 30 days CBC ordered  Adam Phenix, MD

## 2017-05-01 NOTE — Patient Instructions (Signed)
Etonogestrel implant What is this medicine? ETONOGESTREL (et oh noe JES trel) is a contraceptive (birth control) device. It is used to prevent pregnancy. It can be used for up to 3 years. This medicine may be used for other purposes; ask your health care provider or pharmacist if you have questions. COMMON BRAND NAME(S): Implanon, Nexplanon What should I tell my health care provider before I take this medicine? They need to know if you have any of these conditions: -abnormal vaginal bleeding -blood vessel disease or blood clots -cancer of the breast, cervix, or liver -depression -diabetes -gallbladder disease -headaches -heart disease or recent heart attack -high blood pressure -high cholesterol -kidney disease -liver disease -renal disease -seizures -tobacco smoker -an unusual or allergic reaction to etonogestrel, other hormones, anesthetics or antiseptics, medicines, foods, dyes, or preservatives -pregnant or trying to get pregnant -breast-feeding How should I use this medicine? This device is inserted just under the skin on the inner side of your upper arm by a health care professional. Talk to your pediatrician regarding the use of this medicine in children. Special care may be needed. Overdosage: If you think you have taken too much of this medicine contact a poison control center or emergency room at once. NOTE: This medicine is only for you. Do not share this medicine with others. What if I miss a dose? This does not apply. What may interact with this medicine? Do not take this medicine with any of the following medications: -amprenavir -bosentan -fosamprenavir This medicine may also interact with the following medications: -barbiturate medicines for inducing sleep or treating seizures -certain medicines for fungal infections like ketoconazole and itraconazole -grapefruit juice -griseofulvin -medicines to treat seizures like carbamazepine, felbamate, oxcarbazepine,  phenytoin, topiramate -modafinil -phenylbutazone -rifampin -rufinamide -some medicines to treat HIV infection like atazanavir, indinavir, lopinavir, nelfinavir, tipranavir, ritonavir -St. John's wort This list may not describe all possible interactions. Give your health care provider a list of all the medicines, herbs, non-prescription drugs, or dietary supplements you use. Also tell them if you smoke, drink alcohol, or use illegal drugs. Some items may interact with your medicine. What should I watch for while using this medicine? This product does not protect you against HIV infection (AIDS) or other sexually transmitted diseases. You should be able to feel the implant by pressing your fingertips over the skin where it was inserted. Contact your doctor if you cannot feel the implant, and use a non-hormonal birth control method (such as condoms) until your doctor confirms that the implant is in place. If you feel that the implant may have broken or become bent while in your arm, contact your healthcare provider. What side effects may I notice from receiving this medicine? Side effects that you should report to your doctor or health care professional as soon as possible: -allergic reactions like skin rash, itching or hives, swelling of the face, lips, or tongue -breast lumps -changes in emotions or moods -depressed mood -heavy or prolonged menstrual bleeding -pain, irritation, swelling, or bruising at the insertion site -scar at site of insertion -signs of infection at the insertion site such as fever, and skin redness, pain or discharge -signs of pregnancy -signs and symptoms of a blood clot such as breathing problems; changes in vision; chest pain; severe, sudden headache; pain, swelling, warmth in the leg; trouble speaking; sudden numbness or weakness of the face, arm or leg -signs and symptoms of liver injury like dark yellow or brown urine; general ill feeling or flu-like symptoms;  light-colored   stools; loss of appetite; nausea; right upper belly pain; unusually weak or tired; yellowing of the eyes or skin -unusual vaginal bleeding, discharge -signs and symptoms of a stroke like changes in vision; confusion; trouble speaking or understanding; severe headaches; sudden numbness or weakness of the face, arm or leg; trouble walking; dizziness; loss of balance or coordination Side effects that usually do not require medical attention (report to your doctor or health care professional if they continue or are bothersome): -acne -back pain -breast pain -changes in weight -dizziness -general ill feeling or flu-like symptoms -headache -irregular menstrual bleeding -nausea -sore throat -vaginal irritation or inflammation This list may not describe all possible side effects. Call your doctor for medical advice about side effects. You may report side effects to FDA at 1-800-FDA-1088. Where should I keep my medicine? This drug is given in a hospital or clinic and will not be stored at home. NOTE: This sheet is a summary. It may not cover all possible information. If you have questions about this medicine, talk to your doctor, pharmacist, or health care provider.  2018 Elsevier/Gold Standard (2016-02-18 11:19:22)  

## 2017-05-02 LAB — CBC
HEMOGLOBIN: 13.7 g/dL (ref 11.1–15.9)
Hematocrit: 42.5 % (ref 34.0–46.6)
MCH: 28 pg (ref 26.6–33.0)
MCHC: 32.2 g/dL (ref 31.5–35.7)
MCV: 87 fL (ref 79–97)
Platelets: 296 10*3/uL (ref 150–379)
RBC: 4.9 x10E6/uL (ref 3.77–5.28)
RDW: 13.4 % (ref 12.3–15.4)
WBC: 5.3 10*3/uL (ref 3.4–10.8)

## 2017-05-03 ENCOUNTER — Telehealth: Payer: Self-pay

## 2017-05-03 NOTE — Telephone Encounter (Signed)
Per Dr. Debroah Loop, patient's CBC from 05/01/17 was normal. Called patient to inform her of her results. Patient verbalized understanding and had no questions.

## 2017-07-11 ENCOUNTER — Telehealth: Payer: Self-pay | Admitting: General Practice

## 2017-07-11 NOTE — Telephone Encounter (Signed)
Patient called and left message stating she has been bleeding since 11/13. Patient states she has had this problem before and in the past was given medication. Patient states nothing is working now and would like a call back. Called patient she states she has been bleeding heavy to light since 11/13. Patient states the medication previously given isn't helping. Recommended she come in for follow up visit. Patient verbalized understanding & had no questions at this time. Patient will expect call from front office with appt.

## 2017-08-25 ENCOUNTER — Ambulatory Visit (INDEPENDENT_AMBULATORY_CARE_PROVIDER_SITE_OTHER): Payer: Medicaid Other | Admitting: Obstetrics & Gynecology

## 2017-08-25 ENCOUNTER — Encounter: Payer: Self-pay | Admitting: Obstetrics & Gynecology

## 2017-08-25 DIAGNOSIS — N926 Irregular menstruation, unspecified: Secondary | ICD-10-CM | POA: Diagnosis not present

## 2017-08-25 NOTE — Patient Instructions (Signed)
Etonogestrel implant What is this medicine? ETONOGESTREL (et oh noe JES trel) is a contraceptive (birth control) device. It is used to prevent pregnancy. It can be used for up to 3 years. This medicine may be used for other purposes; ask your health care provider or pharmacist if you have questions. COMMON BRAND NAME(S): Implanon, Nexplanon What should I tell my health care provider before I take this medicine? They need to know if you have any of these conditions: -abnormal vaginal bleeding -blood vessel disease or blood clots -cancer of the breast, cervix, or liver -depression -diabetes -gallbladder disease -headaches -heart disease or recent heart attack -high blood pressure -high cholesterol -kidney disease -liver disease -renal disease -seizures -tobacco smoker -an unusual or allergic reaction to etonogestrel, other hormones, anesthetics or antiseptics, medicines, foods, dyes, or preservatives -pregnant or trying to get pregnant -breast-feeding How should I use this medicine? This device is inserted just under the skin on the inner side of your upper arm by a health care professional. Talk to your pediatrician regarding the use of this medicine in children. Special care may be needed. Overdosage: If you think you have taken too much of this medicine contact a poison control center or emergency room at once. NOTE: This medicine is only for you. Do not share this medicine with others. What if I miss a dose? This does not apply. What may interact with this medicine? Do not take this medicine with any of the following medications: -amprenavir -bosentan -fosamprenavir This medicine may also interact with the following medications: -barbiturate medicines for inducing sleep or treating seizures -certain medicines for fungal infections like ketoconazole and itraconazole -grapefruit juice -griseofulvin -medicines to treat seizures like carbamazepine, felbamate, oxcarbazepine,  phenytoin, topiramate -modafinil -phenylbutazone -rifampin -rufinamide -some medicines to treat HIV infection like atazanavir, indinavir, lopinavir, nelfinavir, tipranavir, ritonavir -St. John's wort This list may not describe all possible interactions. Give your health care provider a list of all the medicines, herbs, non-prescription drugs, or dietary supplements you use. Also tell them if you smoke, drink alcohol, or use illegal drugs. Some items may interact with your medicine. What should I watch for while using this medicine? This product does not protect you against HIV infection (AIDS) or other sexually transmitted diseases. You should be able to feel the implant by pressing your fingertips over the skin where it was inserted. Contact your doctor if you cannot feel the implant, and use a non-hormonal birth control method (such as condoms) until your doctor confirms that the implant is in place. If you feel that the implant may have broken or become bent while in your arm, contact your healthcare provider. What side effects may I notice from receiving this medicine? Side effects that you should report to your doctor or health care professional as soon as possible: -allergic reactions like skin rash, itching or hives, swelling of the face, lips, or tongue -breast lumps -changes in emotions or moods -depressed mood -heavy or prolonged menstrual bleeding -pain, irritation, swelling, or bruising at the insertion site -scar at site of insertion -signs of infection at the insertion site such as fever, and skin redness, pain or discharge -signs of pregnancy -signs and symptoms of a blood clot such as breathing problems; changes in vision; chest pain; severe, sudden headache; pain, swelling, warmth in the leg; trouble speaking; sudden numbness or weakness of the face, arm or leg -signs and symptoms of liver injury like dark yellow or brown urine; general ill feeling or flu-like symptoms;  light-colored   stools; loss of appetite; nausea; right upper belly pain; unusually weak or tired; yellowing of the eyes or skin -unusual vaginal bleeding, discharge -signs and symptoms of a stroke like changes in vision; confusion; trouble speaking or understanding; severe headaches; sudden numbness or weakness of the face, arm or leg; trouble walking; dizziness; loss of balance or coordination Side effects that usually do not require medical attention (report to your doctor or health care professional if they continue or are bothersome): -acne -back pain -breast pain -changes in weight -dizziness -general ill feeling or flu-like symptoms -headache -irregular menstrual bleeding -nausea -sore throat -vaginal irritation or inflammation This list may not describe all possible side effects. Call your doctor for medical advice about side effects. You may report side effects to FDA at 1-800-FDA-1088. Where should I keep my medicine? This drug is given in a hospital or clinic and will not be stored at home. NOTE: This sheet is a summary. It may not cover all possible information. If you have questions about this medicine, talk to your doctor, pharmacist, or health care provider.  2018 Elsevier/Gold Standard (2016-02-18 11:19:22)  

## 2017-08-25 NOTE — Progress Notes (Signed)
Subjective:     Patient ID: Allison Shaw, female   DOB: Jul 03, 1996, 22 y.o.   MRN: [id number] BJ:[id number]:irregular bleeding HPIG1P1001 No LMP recorded. Patient has had an implant. She bled for a few weeks after insertion of Nexplanon and then has had no bleeding since November. UPT negative. Past Medical History:  Diagnosis Date  . ADHD (attention deficit hyperactivity disorder)   . Bipolar 1 disorder (HCC)   . Depression   . Mental disorder    Past Surgical History:  Procedure Laterality Date  . 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 Latex Itching and Other (See Comments)    Reaction:  Burning   . Fish Allergy Rash      Review of Systems  Constitutional: Positive for fatigue.  Respiratory: Negative.   Cardiovascular: Positive for palpitations.  Gastrointestinal: Negative.   Genitourinary: Positive for menstrual problem. Negative for vaginal bleeding.  Neurological: Positive for dizziness and light-headedness.       Objective:   Physical Exam  Constitutional: She is oriented to person, place, and time. She appears well-developed. No distress.  Cardiovascular: Normal rate.  Neurological: She is alert and oriented to person, place, and time.  Skin: No pallor.  Psychiatric: She has a normal mood and affect. Her behavior is normal.    Blood pressure (!) 103/58, pulse 77, weight 116 lb (52.6 kg), not currently breastfeeding.      Assessment:     Irregular bleeding is normal with Nexplanon reassurance given Dizziness described may be hypoglycemic episodes, she feels better after eating sugar.     Plan:     Decrease sugar in the diet and get more balanced intake with protein, vegatables, complex carbs Need to have PCP CBC today Sign up for MyChart  Adam PhenixArnold, Prather Failla G, MD 08/25/2017

## 2017-08-25 NOTE — Progress Notes (Signed)
Nov. 13-Dec 12 and since then have not have period.

## 2017-08-26 LAB — CBC
HEMATOCRIT: 40.7 % (ref 34.0–46.6)
Hemoglobin: 13.7 g/dL (ref 11.1–15.9)
MCH: 28.8 pg (ref 26.6–33.0)
MCHC: 33.7 g/dL (ref 31.5–35.7)
MCV: 86 fL (ref 79–97)
Platelets: 294 10*3/uL (ref 150–379)
RBC: 4.75 x10E6/uL (ref 3.77–5.28)
RDW: 13.2 % (ref 12.3–15.4)
WBC: 5.5 10*3/uL (ref 3.4–10.8)

## 2019-06-24 ENCOUNTER — Other Ambulatory Visit: Payer: Self-pay

## 2019-06-24 DIAGNOSIS — Z20822 Contact with and (suspected) exposure to covid-19: Secondary | ICD-10-CM

## 2019-06-25 LAB — NOVEL CORONAVIRUS, NAA: SARS-CoV-2, NAA: NOT DETECTED

## 2019-08-29 ENCOUNTER — Encounter: Payer: Self-pay | Admitting: Obstetrics & Gynecology

## 2019-08-29 ENCOUNTER — Ambulatory Visit (INDEPENDENT_AMBULATORY_CARE_PROVIDER_SITE_OTHER): Payer: Medicaid Other | Admitting: Obstetrics & Gynecology

## 2019-08-29 ENCOUNTER — Other Ambulatory Visit: Payer: Self-pay

## 2019-08-29 VITALS — BP 104/62 | HR 80 | Ht 63.0 in | Wt 116.6 lb

## 2019-08-29 DIAGNOSIS — Z3046 Encounter for surveillance of implantable subdermal contraceptive: Secondary | ICD-10-CM

## 2019-08-29 MED ORDER — NORGESTIM-ETH ESTRAD TRIPHASIC 0.18/0.215/0.25 MG-25 MCG PO TABS: 1.0000 | ORAL_TABLET | Freq: Every day | ORAL | 11 refills | Status: DC

## 2019-08-29 NOTE — Addendum Note (Signed)
Addended by: Adam Phenix on: 08/29/2019 10:42 AM   Modules accepted: Orders

## 2019-08-29 NOTE — Patient Instructions (Signed)
Etonogestrel implant What is this medicine? ETONOGESTREL (et oh noe JES trel) is a contraceptive (birth control) device. It is used to prevent pregnancy. It can be used for up to 3 years. This medicine may be used for other purposes; ask your health care provider or pharmacist if you have questions. COMMON BRAND NAME(S): Implanon, Nexplanon What should I tell my health care provider before I take this medicine? They need to know if you have any of these conditions:  abnormal vaginal bleeding  blood vessel disease or blood clots  breast, cervical, endometrial, ovarian, liver, or uterine cancer  diabetes  gallbladder disease  heart disease or recent heart attack  high blood pressure  high cholesterol or triglycerides  kidney disease  liver disease  migraine headaches  seizures  stroke  tobacco smoker  an unusual or allergic reaction to etonogestrel, anesthetics or antiseptics, other medicines, foods, dyes, or preservatives  pregnant or trying to get pregnant  breast-feeding How should I use this medicine? This device is inserted just under the skin on the inner side of your upper arm by a health care professional. Talk to your pediatrician regarding the use of this medicine in children. Special care may be needed. Overdosage: If you think you have taken too much of this medicine contact a poison control center or emergency room at once. NOTE: This medicine is only for you. Do not share this medicine with others. What if I miss a dose? This does not apply. What may interact with this medicine? Do not take this medicine with any of the following medications:  amprenavir  fosamprenavir This medicine may also interact with the following medications:  acitretin  aprepitant  armodafinil  bexarotene  bosentan  carbamazepine  certain medicines for fungal infections like fluconazole, ketoconazole, itraconazole and voriconazole  certain medicines to treat  hepatitis, HIV or AIDS  cyclosporine  felbamate  griseofulvin  lamotrigine  modafinil  oxcarbazepine  phenobarbital  phenytoin  primidone  rifabutin  rifampin  rifapentine  St. John's wort  topiramate This list may not describe all possible interactions. Give your health care provider a list of all the medicines, herbs, non-prescription drugs, or dietary supplements you use. Also tell them if you smoke, drink alcohol, or use illegal drugs. Some items may interact with your medicine. What should I watch for while using this medicine? This product does not protect you against HIV infection (AIDS) or other sexually transmitted diseases. You should be able to feel the implant by pressing your fingertips over the skin where it was inserted. Contact your doctor if you cannot feel the implant, and use a non-hormonal birth control method (such as condoms) until your doctor confirms that the implant is in place. Contact your doctor if you think that the implant may have broken or become bent while in your arm. You will receive a user card from your health care provider after the implant is inserted. The card is a record of the location of the implant in your upper arm and when it should be removed. Keep this card with your health records. What side effects may I notice from receiving this medicine? Side effects that you should report to your doctor or health care professional as soon as possible:  allergic reactions like skin rash, itching or hives, swelling of the face, lips, or tongue  breast lumps, breast tissue changes, or discharge  breathing problems  changes in emotions or moods  coughing up blood  if you feel that the implant   may have broken or bent while in your arm  high blood pressure  pain, irritation, swelling, or bruising at the insertion site  scar at site of insertion  signs of infection at the insertion site such as fever, and skin redness, pain or  discharge  signs and symptoms of a blood clot such as breathing problems; changes in vision; chest pain; severe, sudden headache; pain, swelling, warmth in the leg; trouble speaking; sudden numbness or weakness of the face, arm or leg  signs and symptoms of liver injury like dark yellow or brown urine; general ill feeling or flu-like symptoms; light-colored stools; loss of appetite; nausea; right upper belly pain; unusually weak or tired; yellowing of the eyes or skin  unusual vaginal bleeding, discharge Side effects that usually do not require medical attention (report to your doctor or health care professional if they continue or are bothersome):  acne  breast pain or tenderness  headache  irregular menstrual bleeding  nausea This list may not describe all possible side effects. Call your doctor for medical advice about side effects. You may report side effects to FDA at 1-800-FDA-1088. Where should I keep my medicine? This drug is given in a hospital or clinic and will not be stored at home. NOTE: This sheet is a summary. It may not cover all possible information. If you have questions about this medicine, talk to your doctor, pharmacist, or health care provider.  2020 Elsevier/Gold Standard (2019-05-14 11:33:04)  

## 2019-08-29 NOTE — Progress Notes (Signed)
GYNECOLOGY OFFICE PROCEDURE NOTE  Allison Shaw is a 24 y.o. G1P1001 here for Nexplanon removal.  No other gynecologic concerns.  Nexplanon Removal Patient identified, informed consent performed, consent signed.   Appropriate time out taken. Nexplanon site identified.  Area prepped in usual sterile fashon. One ml of 1% lidocaine was used to anesthetize the area at the distal end of the implant. A small stab incision was made right beside the implant on the distal portion.  The Nexplanon rod was grasped using hemostats and removed without difficulty.  There was minimal blood loss. There were no complications.    A pressure bandage was applied to reduce any bruising.  The patient tolerated the procedure well and was given post procedure instructions.  Patient is planning to use OCP for contraception     Adam Phenix, MD Attending Obstetrician & Gynecologist, [facility] and [facility]  08/29/2019

## 2019-09-10 ENCOUNTER — Encounter: Payer: Self-pay | Admitting: General Practice

## 2020-08-15 NOTE — L&D Delivery Note (Signed)
OB/GYN Faculty Practice Delivery Note  Allison Shaw is a 25 y.o. [id number] s/p NSVD at [redacted]w[redacted]d. She was admitted for elective IOL.   ROM: 3h 64m with clear fluid GBS Status: Negative  Delivery Date/Time: 04/21/21 at 2256  Delivery: Called to room and patient was complete and pushing. Head delivered LOA. No nuchal cord present. Shoulder and body delivered in usual fashion. Infant with spontaneous cry, placed on mother's abdomen, dried and stimulated. Cord clamped x 2 after 1-minute delay, and cut by delivering clinician. Cord blood drawn. Placenta delivered spontaneously with gentle cord traction. Fundus firm with massage and Pitocin. Labia, perineum, vagina, and cervix were inspected. Patient had a 1st degree perineal laceration that was repaired with 3-0 Vicryl and found to be hemostatic.   Placenta: Intact; 3VC - sent to L&D Complications: None  Lacerations: 1st degree perineal; repaired with 3-0 Vicryl  EBL: 425 cc Analgesia: Epidural  Infant: Viable female  APGARs 9 and 9  Weight pending  Evalina Field, MD  OB/GYN Fellow, Faculty Practice

## 2020-09-07 ENCOUNTER — Encounter: Payer: Self-pay | Admitting: General Practice

## 2020-09-07 ENCOUNTER — Ambulatory Visit (INDEPENDENT_AMBULATORY_CARE_PROVIDER_SITE_OTHER): Payer: Self-pay | Admitting: General Practice

## 2020-09-07 ENCOUNTER — Telehealth: Payer: Self-pay | Admitting: General Practice

## 2020-09-07 ENCOUNTER — Other Ambulatory Visit: Payer: Self-pay

## 2020-09-07 DIAGNOSIS — O219 Vomiting of pregnancy, unspecified: Secondary | ICD-10-CM

## 2020-09-07 DIAGNOSIS — Z3201 Encounter for pregnancy test, result positive: Secondary | ICD-10-CM

## 2020-09-07 LAB — POCT PREGNANCY, URINE: Preg Test, Ur: POSITIVE — AB

## 2020-09-07 MED ORDER — PROMETHAZINE HCL 25 MG PO TABS: 25.0000 mg | ORAL_TABLET | Freq: Four times a day (QID) | ORAL | 0 refills | Status: DC | PRN

## 2020-09-07 NOTE — Telephone Encounter (Signed)
Called patient and discussed EDD/dating. Asked patient about what vitamins/medications she is currently taking. Patient states she just started taking PNV but isn't taking anything else. Asked patient about nausea/vomiting and she reports significant nausea. Rx for phenergan sent to pharmacy and patient informed. Discussed someone from the front office will call her regarding initial OB appt. Patient verbalized understanding.

## 2020-09-07 NOTE — Progress Notes (Signed)
Patient came into office to drop off urine sample for UPT. UPT +.  Called patient at listed phone number but number is no longer in service. Will send patient a mychart message for results/additional information.  Chase Caller RN BSN 09/07/20

## 2020-09-24 ENCOUNTER — Other Ambulatory Visit: Payer: Self-pay

## 2020-09-24 ENCOUNTER — Telehealth (INDEPENDENT_AMBULATORY_CARE_PROVIDER_SITE_OTHER): Payer: Medicaid Other | Admitting: *Deleted

## 2020-09-24 DIAGNOSIS — O99619 Diseases of the digestive system complicating pregnancy, unspecified trimester: Secondary | ICD-10-CM

## 2020-09-24 DIAGNOSIS — Z3490 Encounter for supervision of normal pregnancy, unspecified, unspecified trimester: Secondary | ICD-10-CM

## 2020-09-24 DIAGNOSIS — Z3687 Encounter for antenatal screening for uncertain dates: Secondary | ICD-10-CM

## 2020-09-24 DIAGNOSIS — K5900 Constipation, unspecified: Secondary | ICD-10-CM

## 2020-09-24 MED ORDER — DOCUSATE SODIUM 100 MG PO CAPS: 100.0000 mg | ORAL_CAPSULE | Freq: Two times a day (BID) | ORAL | 0 refills | Status: DC

## 2020-09-24 MED ORDER — BLOOD PRESSURE KIT DEVI: 1.0000 | 0 refills | Status: DC | PRN

## 2020-09-24 NOTE — Progress Notes (Signed)
New OB Intake  I connected with  Allison Shaw on 09/24/20 at 10:20 by MyChart and verified that I am speaking with the correct person using two identifiers. Nurse is located at [facility] and pt is located at home.  I discussed the limitations, risks, security and privacy concerns of performing an evaluation and management service by telephone and the availability of in person appointments. I also discussed with the patient that there may be a patient responsible charge related to this service. The patient expressed understanding and agreed to proceed.  I explained I am completing New OB Intake today. We discussed her EDD of 02/28/21 that is based on LMP of 05/24/20. However we discussed this was not a normal period - she states she has regular periods lasting 7 days, had period 05/24/20 that lasted 4-5 days with spotting . Had period in September that was 7 days. States she has felt baby movement.  Ordered anatomy/ dating [place] for asap on 10/01/20. Pt is G2/P1001. I reviewed her allergies, medications, Medical/Surgical/OB history, and appropriate screenings. I informed her of [facility] services. Based on history, this is a/an uncomplicated pregnancy.  Concerns addressed today  Constipation: C/o constipation and straining. Advised to take colace bid ( RX sent) and metamucil daily. If this does not help to notify us.   Delivery Plans:  Plans to deliver at [facility].   MyChart/Babyscripts MyChart access verified. I explained pt will have some visits in office and some virtually. Babyscripts instructions given.   Blood Pressure Cuff Blood pressure cuff ordered for patient to pick-up from [employer]. Explained after first prenatal appt pt will check weekly and document in Babyscripts.  Anatomy US Explained first scheduled [place] will be around 19 weeks. Anatomy US scheduled for 10/01/20 for dating and anatomy.  Labs Discussed Allison Shaw genetic screening with patient. Would like both Panorama and Horizon  drawn at new OB visit. Routine prenatal labs needed.  Covid Vaccine Patient has not covid vaccine.   WIC Referral Will need to offer referral to [facility].    First visit review I reviewed new OB appt with pt. I explained she will have a pelvic exam, ob bloodwork with genetic screening, and PAP smear. Explained pt will be seen by Dr. Germaine Pomfret at first visit; encounter routed to appropriate provider.   Allison Rocca,RN 09/24/2020  10:22 AM

## 2020-09-24 NOTE — Patient Instructions (Signed)
-   At our [facility], we work as an integrated team, providing care to address both physical and emotional health. Your medical provider may refer you to see our Behavioral Health Clinician (BHC) on the same day you see your medical provider, as availability permits; often scheduled virtually at your convenience.  Our BHC is available to all patients, visits generally last between 20-30 minutes, but can be longer or shorter, depending on patient need. The BHC offers help with stress management, coping with symptoms of depression and anxiety, major life changes , sleep issues, changing risky behavior, grief and loss, life stress, working on personal life goals, and  behavioral health issues, as these all affect your overall health and wellness.  The BHC is NOT available for the following: FMLA paperwork, court-ordered evaluations, specialty assessments (custody or disability), letters to employers, or obtaining certification for an emotional support animal. The BHC does not provide long-term therapy. You have the right to refuse integrated behavioral health services, or to reschedule to see the BHC at a later date.  Confidentiality exception: If it is suspected that a child or disabled adult is being abused or neglected, we are required by law to report that to either Child Protective Services or Adult Protective Services.  If you have a diagnosis of Bipolar affective disorder, Schizophrenia, or recurrent Major depressive disorder, we will recommend that you establish care with a psychiatrist, as these are lifelong, chronic conditions, and we want your overall emotional health and medications to be more closely monitored. If you anticipate needing extended maternity leave due to mental health issues postpartum, it it recommended you inform your medical provider, so we can put in a referral to a  psychiatrist as soon as possible. The BHC is unable to recommend an extended maternity leave for mental  health issues. Your medical provider or BHC may refer you to a therapist for ongoing, traditional therapy, or to a psychiatrist, for medication management, if it would benefit your overall health. Depending on your insurance, you may have a copay to see the BHC. If you are uninsured, it is recommended that you apply for financial assistance. (Forms may be requested at the front desk for in-person visits, via MyChart, or request a form during a virtual visit).  If you see the BHC more than 6 times, you will have to complete a comprehensive clinical assessment interview with the BHC to resume integrated services.  For virtual visits with the BHC, you must be physically in the state of [place] at the time of the visit. For example, if you live in [place], you will have to do an in-person visit with the BHC, and your out-of-state insurance may not cover behavioral health services in [place]. f you are going out of the state or country for any reason, the BHC may see you virtually when you return to [place], but not while you are physically outside of [place].   

## 2020-10-01 ENCOUNTER — Other Ambulatory Visit: Payer: Self-pay

## 2020-10-01 ENCOUNTER — Other Ambulatory Visit: Payer: Self-pay | Admitting: Family Medicine

## 2020-10-01 ENCOUNTER — Ambulatory Visit: Payer: Medicaid Other | Attending: Obstetrics and Gynecology

## 2020-10-01 ENCOUNTER — Other Ambulatory Visit: Payer: Self-pay | Admitting: *Deleted

## 2020-10-01 DIAGNOSIS — Z3490 Encounter for supervision of normal pregnancy, unspecified, unspecified trimester: Secondary | ICD-10-CM | POA: Diagnosis not present

## 2020-10-01 DIAGNOSIS — Z3687 Encounter for antenatal screening for uncertain dates: Secondary | ICD-10-CM

## 2020-10-01 DIAGNOSIS — Z363 Encounter for antenatal screening for malformations: Secondary | ICD-10-CM

## 2020-10-06 ENCOUNTER — Other Ambulatory Visit (HOSPITAL_COMMUNITY)
Admission: RE | Admit: 2020-10-06 | Discharge: 2020-10-06 | Disposition: A | Discharge status: Home / Self Care | Payer: Medicaid Other | Source: Ambulatory Visit | Attending: Family Medicine | Admitting: Family Medicine

## 2020-10-06 ENCOUNTER — Ambulatory Visit (INDEPENDENT_AMBULATORY_CARE_PROVIDER_SITE_OTHER): Payer: Medicaid Other | Admitting: Family Medicine

## 2020-10-06 ENCOUNTER — Other Ambulatory Visit: Payer: Self-pay

## 2020-10-06 VITALS — BP 108/58 | HR 94 | Wt 119.2 lb

## 2020-10-06 DIAGNOSIS — Z3490 Encounter for supervision of normal pregnancy, unspecified, unspecified trimester: Secondary | ICD-10-CM | POA: Insufficient documentation

## 2020-10-06 DIAGNOSIS — Z8759 Personal history of other complications of pregnancy, childbirth and the puerperium: Secondary | ICD-10-CM | POA: Diagnosis not present

## 2020-10-06 DIAGNOSIS — F3181 Bipolar II disorder: Secondary | ICD-10-CM | POA: Diagnosis not present

## 2020-10-06 DIAGNOSIS — F909 Attention-deficit hyperactivity disorder, unspecified type: Secondary | ICD-10-CM

## 2020-10-06 LAB — POCT URINALYSIS DIP (DEVICE)
Bilirubin Urine: NEGATIVE
Glucose, UA: NEGATIVE mg/dL
Hgb urine dipstick: NEGATIVE
Ketones, ur: NEGATIVE mg/dL
Nitrite: NEGATIVE
Protein, ur: NEGATIVE mg/dL
Specific Gravity, Urine: 1.02 (ref 1.005–1.030)
Urobilinogen, UA: 0.2 mg/dL (ref 0.0–1.0)
pH: 7 (ref 5.0–8.0)

## 2020-10-06 NOTE — Progress Notes (Signed)
History:   Allison Shaw is a 25 y.o. G2P1001 at 61w1dby early ultrasound being seen today for her first obstetrical visit.  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 Pregnancy history fully reviewed.  Patient reports no complaints.      HISTORY: OB History  Gravida Para Term Preterm AB Living  '2 1 1 ' 0 0 1  SAB IAB Ectopic Multiple Live Births  0 0 0 0 1    # Outcome Date GA Lbr Len/2nd Weight Sex Delivery Anes PTL Lv  2 Current           1 Term 05/19/16 447w2d 02:15 8 lb 1 oz (3.657 kg) F Vag-Vacuum EPI  LIV     Birth Comments: Induced for Postdates with cytotec, foley bulb. Developed chorioamniontis; went home with foley     Name: Shaw,GIRL Kassondra     Apgar1: 5  Apgar5: 8    No history of pap smear.  Past Medical History:  Diagnosis Date  . ADHD (attention deficit hyperactivity disorder)   . Bipolar 1 disorder ([place]  . Depression   . Mental disorder    Past Surgical History:  Procedure Laterality Date  . 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 Mental illness Mother   . Depression Mother   . Bipolar disorder Mother   . Diabetes Mother   . ADD / ADHD Father    Social History   Tobacco Use  . Smoking status: Former Smoker    Types: Cigarettes  . Smokeless tobacco: Never Used  . Tobacco comment: stopped with pregnancy; around others who smoke  Vaping Use  . Vaping Use: Former  . Quit date: 02/22/2020  Substance Use Topics  . Alcohol use: Not Currently    Comment: occasionally; stopped when found out pregnant- drank alot on New Years   . Drug use: No   Allergies  Allergen Reactions  . Latex Itching and Other (See Comments)    Reaction:  Burning   . Fish Allergy Rash   Current Outpatient Medications on File Prior to Visit  Medication Sig Dispense Refill  . bisacodyl (DULCOLAX) 5 MG EC tablet Take 5 mg by mouth daily as needed for  moderate constipation.    . Blood Pressure Monitoring (BLOOD PRESSURE KIT) DEVI 1 Device by Does not apply route as needed. 1 each 0  . docusate sodium (COLACE) 100 MG capsule Take 1 capsule (100 mg total) by mouth 2 (two) times daily. 30 capsule 0  . Prenatal Vit-Fe Fumarate-FA (PRENATAL VITAMINS PO) Take 1 tablet by mouth daily.    . promethazine (PHENERGAN) 25 MG tablet Take 1 tablet (25 mg total) by mouth every 6 (six) hours as needed for nausea or vomiting. 30 tablet 0   No current facility-administered medications on file prior to visit.    Review of Systems Pertinent items noted in HPI and remainder of comprehensive ROS otherwise negative. Physical Exam:   Vitals:   10/06/20 1044  BP: (!) 108/58  Pulse: 94  Weight: 119 lb 3.2 oz (54.1 kg)   Fetal Heart Rate (bpm): 165  Uterus:     Pelvic Exam: Perineum: no hemorrhoids, normal perineum   Vulva: normal external genitalia, no lesions   Vagina:  normal mucosa, normal discharge   Cervix: no lesions and normal, pap smear done.    Adnexa: normal adnexa and no  mass, fullness, tenderness   Bony Pelvis: average  System: General: well-developed, well-nourished female in no acute distress   Breasts:  normal appearance, no masses or tenderness bilaterally   Skin: normal coloration and turgor, no rashes   Neurologic: oriented, normal, negative, normal mood   Extremities: normal strength, tone, and muscle mass, ROM of all joints is normal   HEENT PERRLA, extraocular movement intact and sclera clear, anicteric   Mouth/Teeth mucous membranes moist, pharynx normal without lesions and dental hygiene good   Neck supple and no masses   Cardiovascular: regular rate and rhythm   Respiratory:  no respiratory distress, normal breath sounds   Abdomen: soft, non-tender; bowel sounds normal; no masses,  no organomegaly    Assessment:    Pregnancy: G2P1001 Patient Active Problem List   Diagnosis Date Noted  . Supervision of low-risk pregnancy  09/24/2020  . Bipolar 2 disorder ([place]) 04/20/2016  . Attention deficit hyperactivity disorder (ADHD) 04/20/2016     Plan:    1. Encounter for supervision of low-risk pregnancy, antepartum -Doing well without complaints. -Taking PNV -AFP at next appt  -Discussed contraception extensively, patient undecided but is thinking about BTL. Counseled extensively on patient's age and risk of regret after BTL with 2 children. Patient will continue to think about this, already had nexplanon and depo and unhappy with these, got pregnant while taking pills. Discussed Liletta vs BTL. Additionally patient thinks her mother had ovarian cancer and maybe uterine cancer, unsure if she had BRCA testing, discussed that prophylactic hysterectomy would not be completed postpartum but if she were to need this surgery she would not also need a BTL.  Additionally, Continue to discuss. - CBC/D/Plt+RPR+Rh+ABO+Rub Ab... - GC/Chlamydia probe amp ([place])not at [facility] - Culture, OB Urine - Genetic Screening - Cytology - PAP( [place])  History of VAVD -Patient endorses CPD and VAVD with last delivery. Pelvis proven to 3657g. Discussed risks/benefits of cesarean section vs vaginal delivery as patient asking about prospect of cesarean section. Discussed we would prefer a vaginal delivery and we will monitor EFW.  Bipolar disorder/ADHD -not on meds -BH referral placed  Initial labs drawn. Continue prenatal vitamins. Problem list reviewed and updated. Genetic Screening discussed, NIPS: ordered. Ultrasound discussed; fetal anatomic survey: scheduled. Anticipatory guidance about prenatal visits given including labs, ultrasounds, and testing. Discussed usage of Babyscripts and virtual visits as additional source of managing and completing prenatal visits in midst of coronavirus and pandemic.   Encouraged to complete MyChart Registration for her ability to review results, send requests, and have questions addressed.   The nature of [place] for [facility] Healthcare/Faculty Practice with multiple MDs and Advanced Practice Providers was explained to patient; also emphasized that residents, students are part of our team. Routine obstetric precautions reviewed. Encouraged to seek out care at office or emergency room [facility] preferred) for urgent and/or emergent concerns. No follow-ups on file.     Arrie Senate, MD OB Fellow, [place] for [place] 10/06/2020 11:26 AM

## 2020-10-06 NOTE — Progress Notes (Signed)
Here for initial prenatal visit. Has picked up bp cuff; forgot to bring to appointment. States she has used it once. Was 94/52. Has downloaded Babyscripts app. Pregnancy medicaid home form completed. Would like panorma/ horizon drawn today. Would like WIC referral.  Allison Schiller,RN

## 2020-10-07 ENCOUNTER — Encounter: Payer: Self-pay | Admitting: *Deleted

## 2020-10-07 LAB — CBC/D/PLT+RPR+RH+ABO+RUB AB...
Antibody Screen: NEGATIVE
Basophils Absolute: 0 10*3/uL (ref 0.0–0.2)
Basos: 1 %
EOS (ABSOLUTE): 0.1 10*3/uL (ref 0.0–0.4)
Eos: 2 %
HCV Ab: <0.1 s/co ratio (ref 0.0–0.9)
HIV Screen 4th Generation wRfx: NONREACTIVE
Hematocrit: 37.6 % (ref 34.0–46.6)
Hemoglobin: 13.1 g/dL (ref 11.1–15.9)
Hepatitis B Surface Ag: NEGATIVE
Immature Grans (Abs): 0 10*3/uL (ref 0.0–0.1)
Immature Granulocytes: 0 %
Lymphocytes Absolute: 1.5 10*3/uL (ref 0.7–3.1)
Lymphs: 23 %
MCH: 30.3 pg (ref 26.6–33.0)
MCHC: 34.8 g/dL (ref 31.5–35.7)
MCV: 87 fL (ref 79–97)
Monocytes Absolute: 0.3 10*3/uL (ref 0.1–0.9)
Monocytes: 5 %
Neutrophils Absolute: 4.5 10*3/uL (ref 1.4–7.0)
Neutrophils: 69 %
Platelets: 247 10*3/uL (ref 150–450)
RBC: 4.32 x10E6/uL (ref 3.77–5.28)
RDW: 13.5 % (ref 11.7–15.4)
RPR Ser Ql: NONREACTIVE
Rh Factor: POSITIVE
Rubella Antibodies, IGG: <0.9 index — ABNORMAL LOW (ref >0.99)
WBC: 6.5 10*3/uL (ref 3.4–10.8)

## 2020-10-07 LAB — HCV INTERPRETATION

## 2020-10-07 LAB — CYTOLOGY - PAP: Diagnosis: NEGATIVE

## 2020-10-07 LAB — GC/CHLAMYDIA PROBE AMP (~~LOC~~) NOT AT ARMC
Chlamydia: NEGATIVE
Comment: NEGATIVE
Comment: NORMAL
Neisseria Gonorrhea: NEGATIVE

## 2020-10-08 ENCOUNTER — Encounter: Payer: Self-pay | Admitting: Family Medicine

## 2020-10-08 DIAGNOSIS — Z283 Underimmunization status: Secondary | ICD-10-CM | POA: Insufficient documentation

## 2020-10-08 DIAGNOSIS — O09899 Supervision of other high risk pregnancies, unspecified trimester: Secondary | ICD-10-CM | POA: Insufficient documentation

## 2020-10-08 DIAGNOSIS — Z2839 Other underimmunization status: Secondary | ICD-10-CM | POA: Insufficient documentation

## 2020-10-08 LAB — URINE CULTURE, OB REFLEX

## 2020-10-08 LAB — CULTURE, OB URINE

## 2020-10-13 ENCOUNTER — Encounter: Payer: Self-pay | Admitting: *Deleted

## 2020-10-13 NOTE — BH Specialist Note (Signed)
Integrated Behavioral Health via Telemedicine Visit  10/13/2020 Allison Shaw [id number]  Number of Integrated Behavioral Health visits: 1 Session Start time: 1:15  Session End time: 2:12 Total time: 22  Referring Provider: Mart Piggs, MD Patient/Family location: Home [facility] Provider location: Center for [facility] at [facility]  All persons participating in visit: Patient Allison Shaw and [facility] Chun Sellen   Types of Service: Individual psychotherapy and Video visit  I connected with Allison Shaw and/or Allison Shaw's 4yo daughter by Telephone  (Video is Surveyor, mining) and verified that I am speaking with the correct person using two identifiers.Discussed confidentiality: Yes   I discussed the limitations of telemedicine and the availability of in person appointments.  Discussed there is a possibility of technology failure and discussed alternative modes of communication if that failure occurs.  I discussed that engaging in this telemedicine visit, they consent to the provision of behavioral healthcare and the services will be billed under their insurance.  Patient and/or legal guardian expressed understanding and consented to Telemedicine visit: Yes   Presenting Concerns: Patient and/or family reports the following symptoms/concerns: Pt states her primary concern today is adjusting to current pregnancy after a traumatic birth experience with first pregnancy; has managed ADHD and Bipolar 2 unmedicated for over six years with supportive family and self-coping strategies (humor, positive mindset, daily schedule, healthy meals, prioritize sleep). Sleep is most problematic when drinking soda later in day. Pt experienced anorexia as a teen, and worries about family history of diabetes, obesity and heart problems(25yo has heart issues); no anorexia since before first pregnancy.  Duration of problem: Ongoing; Severity of problem:  mild  Patient and/or Family's Strengths/Protective Factors: Social connections, Social and Emotional competence, Concrete supports in place (healthy food, safe environments, etc.), Sense of purpose and Physical Health (exercise, healthy diet, medication compliance, etc.)  Goals Addressed: Patient will:  1.  Increase knowledge and/or ability of: healthy habits  2.  Demonstrate ability to: Increase healthy adjustment to current life circumstances  Progress towards Goals: Ongoing  Interventions: Interventions utilized:  Sleep Hygiene, Psychoeducation and/or Health Education and Link to [employer] Standardized Assessments completed: Not Needed  Patient and/or Family Response: Pt agrees with treatment plan  Assessment: Patient currently experiencing ADHD and Bipolar 2 disorder (both previously diagnosed via psychiatry)  Patient may benefit from psychoeducation and brief therapeutic interventions regarding preventing escalation of depression and anxiety in pregnancy .  Plan: 1. Follow up with behavioral health clinician on : Two months; Call Jemiah Cuadra at [phone], as needed, prior to scheduled visit 2. Behavioral recommendations:  -Resume taking prenatal vitamin daily -Continue using a routine daily schedule, along with family humor, to manage emotions in pregnancy -Geologist, engineering of new [facility] at www.[facility].com  -Consider switching to non-caffeine soda after lunchtime daily (ex. Sprite, 7Up, root beer, etc.) to help improve sleep quality 3. Referral(s): Integrated [employer] (In Clinic)  I discussed the assessment and treatment plan with the patient and/or parent/guardian. They were provided an opportunity to ask questions and all were answered. They agreed with the plan and demonstrated an understanding of the instructions.   They were advised to call back or seek an in-person evaluation if the symptoms worsen or if the  condition fails to improve as anticipated.  Rae Lips, LCSW   Depression screen [facility] 2/9 10/06/2020 09/24/2020 05/01/2017 06/27/2016 05/04/2016  Decreased Interest 0 1 0 0 0  Down, Depressed, Hopeless 0 0 0 1 0  PHQ -  2 Score 0 1 0 1 0  Altered sleeping 1 1 0 0 1  Tired, decreased energy 1 3 3  0 1  Change in appetite 1 3 2  0 0  Feeling bad or failure about yourself  0 0 0 0 0  Trouble concentrating 0 1 0 0 0  Moving slowly or fidgety/restless 0 3 0 0 0  Suicidal thoughts 0 0 0 0 0  PHQ-9 Score 3 12 5 1 2    GAD 7 : Generalized Anxiety Score 10/06/2020 09/24/2020 05/01/2017 06/27/2016  Nervous, Anxious, on Edge 0 0 0 0  Control/stop worrying 0 0 0 0  Worry too much - different things 0 0 0 0  Trouble relaxing 0 0 0 0  Restless 1 3 0 0  Easily annoyed or irritable 0 1 0 0  Afraid - awful might happen 0 0 0 0  Total GAD 7 Score 1 4 0 0

## 2020-10-27 ENCOUNTER — Ambulatory Visit (INDEPENDENT_AMBULATORY_CARE_PROVIDER_SITE_OTHER): Payer: Medicaid Other | Admitting: Clinical

## 2020-10-27 DIAGNOSIS — F909 Attention-deficit hyperactivity disorder, unspecified type: Secondary | ICD-10-CM | POA: Diagnosis not present

## 2020-10-27 DIAGNOSIS — F3181 Bipolar II disorder: Secondary | ICD-10-CM

## 2020-10-27 DIAGNOSIS — Z8659 Personal history of other mental and behavioral disorders: Secondary | ICD-10-CM

## 2020-10-27 NOTE — Patient Instructions (Signed)
Center for [facility] at [facility] [address] [place], [place] [address] [phone] (main office) [phone] [facility] office)  Www.[facility].com   /Emotional Wellbeing Apps and Websites Here are a few free apps meant to help you to help yourself.  To find, try searching on the internet to see if the app is offered on Apple/Android devices. If your first choice doesn't come up on your device, the good news is that there are many choices! Play around with different apps to see which ones are helpful to you.    Calm This is an app meant to help increase calm feelings. Includes info, strategies, and tools for tracking your feelings.      Calm Harm  This app is meant to help with self-harm. Provides many 5-minute or 15-min coping strategies for doing instead of hurting yourself.       Healthy Minds Health Minds is a problem-solving tool to help deal with emotions and cope with stress you encounter wherever you are.      MindShift This app can help people cope with anxiety. Rather than trying to avoid anxiety, you can make an important shift and face it.      MY3  MY3 features a support system, safety plan and resources with the goal of offering a tool to use in a time of need.       My Life My Voice  This mood journal offers a simple solution for tracking your thoughts, feelings and moods. Animated emoticons can help identify your mood.       Relax Melodies Designed to help with sleep, on this app you can mix sounds and meditations for relaxation.      Smiling Mind Smiling Mind is meditation made easy: it's a simple tool that helps put a smile on your mind.        Stop, Breathe & Think  A friendly, simple guide for people through meditations for mindfulness and compassion.  Stop, Breathe and Think Kids Enter your current feelings and choose a "mission" to help you cope. Offers videos for certain moods instead of just sound  recordings.       Team Orange The goal of this tool is to help teens change how they think, act, and react. This app helps you focus on your own good feelings and experiences.      The [employer] Box The [employer] Box (VHB) contains simple tools to help patients with coping, relaxation, distraction, and positive thinking.

## 2020-11-03 ENCOUNTER — Encounter: Payer: Self-pay | Admitting: Nurse Practitioner

## 2020-11-03 ENCOUNTER — Other Ambulatory Visit: Payer: Self-pay

## 2020-11-03 ENCOUNTER — Ambulatory Visit (INDEPENDENT_AMBULATORY_CARE_PROVIDER_SITE_OTHER): Payer: Medicaid Other | Admitting: Nurse Practitioner

## 2020-11-03 VITALS — BP 104/62 | HR 88 | Wt 125.4 lb

## 2020-11-03 DIAGNOSIS — Z3490 Encounter for supervision of normal pregnancy, unspecified, unspecified trimester: Secondary | ICD-10-CM

## 2020-11-03 DIAGNOSIS — Z3A15 15 weeks gestation of pregnancy: Secondary | ICD-10-CM

## 2020-11-03 DIAGNOSIS — N393 Stress incontinence (female) (male): Secondary | ICD-10-CM

## 2020-11-03 DIAGNOSIS — O09299 Supervision of pregnancy with other poor reproductive or obstetric history, unspecified trimester: Secondary | ICD-10-CM | POA: Insufficient documentation

## 2020-11-03 DIAGNOSIS — Z8659 Personal history of other mental and behavioral disorders: Secondary | ICD-10-CM

## 2020-11-03 NOTE — Progress Notes (Signed)
    Subjective:  Allison Shaw is a 25 y.o. G2P1001 at [redacted]w[redacted]d being seen today for ongoing prenatal care.  She is currently monitored for the following issues for this low-risk pregnancy and has Bipolar 2 disorder (HCC); Attention deficit hyperactivity disorder (ADHD); Supervision of low-risk pregnancy; History of vacuum extraction assisted delivery; and Rubella non-immune status, antepartum on their problem list.  Patient reports pain in LLQ periodically.  Contractions: Not present. Vag. Bleeding: None.  Movement: Present. Denies leaking of fluid.   The following portions of the patient's history were reviewed and updated as appropriate: allergies, current medications, past family history, past medical history, past social history, past surgical history and problem list. Problem list updated.  Objective:   Vitals:   11/03/20 0928  BP: 104/62  Pulse: 88  Weight: 125 lb 6.4 oz (56.9 kg)    Fetal Status: Fetal Heart Rate (bpm): 140   Movement: Present     General:  Alert, oriented and cooperative. Patient is in no acute distress.  Skin: Skin is warm and dry. No rash noted.   Cardiovascular: Normal heart rate noted  Respiratory: Normal respiratory effort, no problems with respiration noted  Abdomen: Soft, gravid, appropriate for gestational age. Pain/Pressure: Present     Pelvic:  Cervical exam deferred        Extremities: Normal range of motion.  Edema: None  Mental Status: Normal mood and affect. Normal behavior. Normal judgment and thought content.   Urinalysis:      Assessment and Plan:  Pregnancy: G2P1001 at [redacted]w[redacted]d  1. Encounter for supervision of low-risk pregnancy, antepartum Worried about diabetes - history in her family Drinking lots of fluids AFP at next visit Does not plan to get Covid vaccine  2. History of eating disorder Has seen Asher Muir and has another [facility] appointment  3. Stress incontinence of urine Hx of unable to void after last birth and left hospital with  indwelling catheter Reviewed Kegels Advised if these exercises are not helping her, will refer to pelvic PT  4. Round ligament pain Possibly the cause of her LLQ pain Also has some pain in the upper part of her abdomen above the umbilicus - likely normal stretching in pregnancy  Preterm labor symptoms and general obstetric precautions including but not limited to vaginal bleeding, contractions, leaking of fluid and fetal movement were reviewed in detail with the patient. Please refer to After Visit Summary for other counseling recommendations.  Return in about 4 weeks (around 12/01/2020) for in person ROB and AFP.  Nolene Bernheim, RN, MSN, NP-BC Nurse Practitioner, [facility] for [employer], [facility] 11/03/2020 10:10 AM

## 2020-11-03 NOTE — Patient Instructions (Signed)

## 2020-11-03 NOTE — Progress Notes (Signed)
States discharge like " pee- , liquid". Sumaiya Arruda,RN

## 2020-11-24 ENCOUNTER — Encounter: Payer: Self-pay | Admitting: General Practice

## 2020-11-26 ENCOUNTER — Other Ambulatory Visit: Payer: Self-pay

## 2020-11-26 ENCOUNTER — Other Ambulatory Visit: Payer: Self-pay | Admitting: Obstetrics and Gynecology

## 2020-11-26 ENCOUNTER — Ambulatory Visit: Payer: Medicaid Other | Attending: Obstetrics and Gynecology

## 2020-11-26 DIAGNOSIS — O09299 Supervision of pregnancy with other poor reproductive or obstetric history, unspecified trimester: Secondary | ICD-10-CM | POA: Diagnosis not present

## 2020-11-26 DIAGNOSIS — Z363 Encounter for antenatal screening for malformations: Secondary | ICD-10-CM | POA: Diagnosis not present

## 2020-11-27 ENCOUNTER — Other Ambulatory Visit: Payer: Medicaid Other

## 2020-12-02 ENCOUNTER — Other Ambulatory Visit: Payer: Self-pay

## 2020-12-02 ENCOUNTER — Ambulatory Visit (INDEPENDENT_AMBULATORY_CARE_PROVIDER_SITE_OTHER): Payer: Medicaid Other | Admitting: Certified Nurse Midwife

## 2020-12-02 VITALS — BP 101/65 | HR 102 | Wt 131.3 lb

## 2020-12-02 DIAGNOSIS — Z3492 Encounter for supervision of normal pregnancy, unspecified, second trimester: Secondary | ICD-10-CM

## 2020-12-02 DIAGNOSIS — O99612 Diseases of the digestive system complicating pregnancy, second trimester: Secondary | ICD-10-CM

## 2020-12-02 DIAGNOSIS — K219 Gastro-esophageal reflux disease without esophagitis: Secondary | ICD-10-CM

## 2020-12-02 DIAGNOSIS — Z3A19 19 weeks gestation of pregnancy: Secondary | ICD-10-CM

## 2020-12-02 MED ORDER — PANTOPRAZOLE SODIUM 40 MG PO TBEC: 40.0000 mg | DELAYED_RELEASE_TABLET | Freq: Every day | ORAL | 0 refills | Status: DC

## 2020-12-02 NOTE — Addendum Note (Signed)
Addended by: Edd Arbour on: 12/02/2020 09:28 AM   Modules accepted: Orders

## 2020-12-02 NOTE — Patient Instructions (Signed)
Second Trimester of Pregnancy  The second trimester of pregnancy is from week 13 through week 27. This is months 4 through 6 of pregnancy. The second trimester is often a time when you feel your best. Your body has adjusted to being pregnant, and you begin to feel better physically. During the second trimester:  Morning sickness has lessened or stopped completely.  You may have more energy.  You may have an increase in appetite. The second trimester is also a time when the unborn baby (fetus) is growing rapidly. At the end of the sixth month, the fetus may be up to 12 inches long and weigh about 1 pounds. You will likely begin to feel the baby move (quickening) between 16 and 20 weeks of pregnancy. Body changes during your second trimester Your body continues to go through many changes during your second trimester. The changes vary and generally return to normal after the baby is born. Physical changes  Your weight will continue to increase. You will notice your lower abdomen bulging out.  You may begin to get stretch marks on your hips, abdomen, and breasts.  Your breasts will continue to grow and to become tender.  Dark spots or blotches (chloasma or mask of pregnancy) may develop on your face.  A dark line from your belly button to the pubic area (linea nigra) may appear.  You may have changes in your hair. These can include thickening of your hair, rapid growth, and changes in texture. Some people also have hair loss during or after pregnancy, or hair that feels dry or thin. Health changes  You may develop headaches.  You may have heartburn.  You may develop constipation.  You may develop hemorrhoids or swollen, bulging veins (varicose veins).  Your gums may bleed and may be sensitive to brushing and flossing.  You may urinate more often because the fetus is pressing on your bladder.  You may have back pain. This is caused by: ? Weight gain. ? Pregnancy hormones that  are relaxing the joints in your pelvis. ? A shift in weight and the muscles that support your balance. Follow these instructions at home: Medicines  Follow your health care provider's instructions regarding medicine use. Specific medicines may be either safe or unsafe to take during pregnancy. Do not take any medicines unless approved by your health care provider.  Take a prenatal vitamin that contains at least 600 micrograms (mcg) of folic acid. Eating and drinking  Eat a healthy diet that includes fresh fruits and vegetables, whole grains, good sources of protein such as meat, eggs, or tofu, and low-fat dairy products.  Avoid raw meat and unpasteurized juice, milk, and cheese. These carry germs that can harm you and your baby.  You may need to take these actions to prevent or treat constipation: ? Drink enough fluid to keep your urine pale yellow. ? Eat foods that are high in fiber, such as beans, whole grains, and fresh fruits and vegetables. ? Limit foods that are high in fat and processed sugars, such as fried or sweet foods. Activity  Exercise only as directed by your health care provider. Most people can continue their usual exercise routine during pregnancy. Try to exercise for 30 minutes at least 5 days a week. Stop exercising if you develop contractions in your uterus.  Stop exercising if you develop pain or cramping in the lower abdomen or lower back.  Avoid exercising if it is very hot or humid or if you are at   a high altitude.  Avoid heavy lifting.  If you choose to, you may have sex unless your health care provider tells you not to. Relieving pain and discomfort  Wear a supportive bra to prevent discomfort from breast tenderness.  Take warm sitz baths to soothe any pain or discomfort caused by hemorrhoids. Use hemorrhoid cream if your health care provider approves.  Rest with your legs raised (elevated) if you have leg cramps or low back pain.  If you develop  varicose veins: ? Wear support hose as told by your health care provider. ? Elevate your feet for 15 minutes, 3-4 times a day. ? Limit salt in your diet. Safety  Wear your seat belt at all times when driving or riding in a car.  Talk with your health care provider if someone is verbally or physically abusive to you. Lifestyle  Do not use hot tubs, steam rooms, or saunas.  Do not douche. Do not use tampons or scented sanitary pads.  Avoid cat litter boxes and soil used by cats. These carry germs that can cause birth defects in the baby and possibly loss of the fetus by miscarriage or stillbirth.  Do not use herbal remedies, alcohol, illegal drugs, or medicines that are not approved by your health care provider. Chemicals in these products can harm your baby.  Do not use any products that contain nicotine or tobacco, such as cigarettes, e-cigarettes, and chewing tobacco. If you need help quitting, ask your health care provider. General instructions  During a routine prenatal visit, your health care provider will do a physical exam and other tests. He or she will also discuss your overall health. Keep all follow-up visits. This is important.  Ask your health care provider for a referral to a local prenatal education class.  Ask for help if you have counseling or nutritional needs during pregnancy. Your health care provider can offer advice or refer you to specialists for help with various needs. Where to find more information  American Pregnancy Association: americanpregnancy.org  American College of Obstetricians and Gynecologists: acog.org/en/Womens%20Health/Pregnancy  Office on Women's Health: womenshealth.gov/pregnancy Contact a health care provider if you have:  A headache that does not go away when you take medicine.  Vision changes or you see spots in front of your eyes.  Mild pelvic cramps, pelvic pressure, or nagging pain in the abdominal area.  Persistent nausea,  vomiting, or diarrhea.  A bad-smelling vaginal discharge or foul-smelling urine.  Pain when you urinate.  Sudden or extreme swelling of your face, hands, ankles, feet, or legs.  A fever. Get help right away if you:  Have fluid leaking from your vagina.  Have spotting or bleeding from your vagina.  Have severe abdominal cramping or pain.  Have difficulty breathing.  Have chest pain.  Have fainting spells.  Have not felt your baby move for the time period told by your health care provider.  Have new or increased pain, swelling, or redness in an arm or leg. Summary  The second trimester of pregnancy is from week 13 through week 27 (months 4 through 6).  Do not use herbal remedies, alcohol, illegal drugs, or medicines that are not approved by your health care provider. Chemicals in these products can harm your baby.  Exercise only as directed by your health care provider. Most people can continue their usual exercise routine during pregnancy.  Keep all follow-up visits. This is important. This information is not intended to replace advice given to you by your   health care provider. Make sure you discuss any questions you have with your health care provider. Document Revised: 01/08/2020 Document Reviewed: 11/14/2019 Elsevier Patient Education  2021 Elsevier Inc.   Alpha-Fetoprotein Test Why am I having this test? The alpha-fetoprotein test is a lab test most commonly used for pregnant women to help screen for birth defects in their unborn baby. It can be used to screen for chromosome (DNA) abnormalities, problems with the brain or spinal cord, or problems with the abdominal wall of the unborn baby (fetus). The alpha-fetoprotein test may also be done for men or nonpregnant women to check for certain cancers. What is being tested? This test measures the amount of alpha-fetoprotein (AFP) in your blood. AFP is a protein that is made by the liver. Levels can be detected in the  mother's blood during pregnancy, starting at 10 weeks and peaking at 16-18 weeks of the pregnancy. Abnormal levels can sometimes be a sign of a birth defect in the baby. Certain cancers can cause a high level of AFP in men and nonpregnant women. What kind of sample is taken? A blood sample is required for this test. It is usually collected by inserting a needle into a blood vessel.   How are the results reported? Your test results will be reported as values. Your health care provider will compare your results to normal ranges that were established after testing a large group of people (reference values). Reference values may vary among labs and hospitals. For this test, common reference values are:  Adult: Less than 40 ng/mL or less than 40 mcg/L (SI units).  Child younger than 1 year: Less than 30 ng/mL. If you are pregnant, the values may also vary based on how long you have been pregnant. What do the results mean? Results that are above the reference values in pregnant women may indicate the following for the baby:  Neural tube defects, such as abnormalities of the spinal cord or brain.  Abdominal wall defects.  Multiple pregnancy such as twins.  Fetal distress or fetal death. Results that are above the reference values in men or nonpregnant women may indicate:  Reproductive cancers, such as ovarian or testicular cancer.  Liver cancer.  Liver cell death.  Other types of cancer. Very low levels of AFP in pregnant women may indicate Down syndrome for the baby. Talk with your health care provider about what your results mean. Questions to ask your health care provider Ask your health care provider, or the department that is doing the test:  When will my results be ready?  How will I get my results?  What are my treatment options?  What other tests do I need?  What are my next steps? Summary  The alpha-fetoprotein test is done on pregnant women to help screen for birth  defects in their unborn baby.  Certain cancers can cause a high level of AFP in men and nonpregnant women.  For this test, a blood sample is usually collected by inserting a needle into a blood vessel.  Talk with your health care provider about what your results mean. This information is not intended to replace advice given to you by your health care provider. Make sure you discuss any questions you have with your health care provider. Document Revised: 02/21/2020 Document Reviewed: 02/21/2020 Elsevier Patient Education  2021 [employer].

## 2020-12-02 NOTE — Progress Notes (Addendum)
   PRENATAL VISIT NOTE  Subjective:  Allison Shaw is a 25 y.o. G2P1001 at [redacted]w[redacted]d being seen today for ongoing prenatal care.  She is currently monitored for the following issues for this low-risk pregnancy and has Bipolar 2 disorder (HCC); Attention deficit hyperactivity disorder (ADHD); Supervision of low-risk pregnancy; History of vacuum extraction assisted delivery; Rubella non-immune status, antepartum; and Hx of fetal anomaly in prior pregnancy, currently pregnant on their problem list.  Patient reports heartburn that causes occasional vomiting.  Contractions: Not present. Vag. Bleeding: None.  Movement: Present. Denies leaking of fluid.   The following portions of the patient's history were reviewed and updated as appropriate: allergies, current medications, past family history, past medical history, past social history, past surgical history and problem list.   Objective:   Vitals:   12/02/20 0821  BP: 101/65  Pulse: (!) 102  Weight: 131 lb 4.8 oz (59.6 kg)    Fetal Status: Fetal Heart Rate (bpm): 145 Fundal Height: 19 cm Movement: Present     General:  Alert, oriented and cooperative. Patient is in no acute distress.  Skin: Skin is warm and dry. No rash noted.   Cardiovascular: Normal heart rate noted  Respiratory: Normal respiratory effort, no problems with respiration noted  Abdomen: Soft, gravid, appropriate for gestational age.  Pain/Pressure: Present     Pelvic: Cervical exam deferred        Extremities: Normal range of motion.  Edema: None  Mental Status: Normal mood and affect. Normal behavior. Normal judgment and thought content.   Assessment and Plan:  Pregnancy: G2P1001 at [redacted]w[redacted]d 1. Supervision of low risk pregnancy in second trimester - Doing well, feeling vigorous fetal movement - Requested BTL after delivery, very firm on decision and understands permanence. Given her family situation with high-needs oldest child, she has no desire to be pregnant or have  additional children after this pregnancy. BTL consent signed.  2. [redacted] weeks gestation of pregnancy - Pt requests anemia check since she had an episode of light-headedness after vomiting - CBC  - AFP, Serum, Open Spina Bifida  3. Gastroesophageal reflux during pregnancy in second trimester, antepartum - had an episode of vomiting two hours after a meal, starting to use Tums multiple times per day - pantoprazole (PROTONIX) 40 MG tablet; Take 1 tablet (40 mg total) by mouth daily.  Dispense: 30 tablet; Refill: 0  Preterm labor symptoms and general obstetric precautions including but not limited to vaginal bleeding, contractions, leaking of fluid and fetal movement were reviewed in detail with the patient. Please refer to After Visit Summary for other counseling recommendations.   Return in about 4 weeks (around 12/30/2020) for IN-PERSON, LOB.  Future Appointments  Date Time Provider Department Center  12/29/2020  1:15 PM [facility] HE[facility] CLINICIAN [facility]  12/31/2020  8:55 AM Crisoforo Oxford, Charlesetta Garibaldi, CNM [facility]    Bernerd Limbo, CNM

## 2020-12-04 LAB — AFP, SERUM, OPEN SPINA BIFIDA
AFP MoM: 0.93
AFP Value: 54.6 ng/mL
Gest. Age on Collection Date: 19.2 weeks
Maternal Age At EDD: 25.1 yr
OSBR Risk 1 IN: 10000
Test Results:: NEGATIVE
Weight: 131 [lb_av]

## 2020-12-04 LAB — CBC
Hematocrit: 36.3 % (ref 34.0–46.6)
Hemoglobin: 12.1 g/dL (ref 11.1–15.9)
MCH: 30.3 pg (ref 26.6–33.0)
MCHC: 33.3 g/dL (ref 31.5–35.7)
MCV: 91 fL (ref 79–97)
Platelets: 203 10*3/uL (ref 150–450)
RBC: 4 x10E6/uL (ref 3.77–5.28)
RDW: 12.4 % (ref 11.7–15.4)
WBC: 8.4 10*3/uL (ref 3.4–10.8)

## 2020-12-15 NOTE — BH Specialist Note (Signed)
Integrated Behavioral Health via Telemedicine Visit  12/15/2020 Allison Shaw [id number]  Number of Integrated Behavioral Health visits: 2 Session Start time: 1:17  Session End time: 1:37 Total time: 20  Referring Provider: Mart Piggs, MD Patient/Family location: Home [facility] Provider location: [facility]  All persons participating in visit: Patient Allison Shaw and Allison Shaw   Types of Service: Individual psychotherapy and Video visit  I connected with Allison Shaw and/or Allison Shaw's daughter via  Telephone or Engineer, civil (consulting)  (Video is Caregility application) and verified that I am speaking with the correct person using two identifiers. Discussed confidentiality: Yes   I discussed the limitations of telemedicine and the availability of in person appointments.  Discussed there is a possibility of technology failure and discussed alternative modes of communication if that failure occurs.  I discussed that engaging in this telemedicine visit, they consent to the provision of behavioral healthcare and the services will be billed under their insurance.  Patient and/or legal guardian expressed understanding and consented to Telemedicine visit: Yes   Presenting Concerns: Patient and/or family reports the following symptoms/concerns: Pt states her primary concerns today are poor sleep, Braxton Hicks contractions, "lightheaded, dizzy, severe pelvic pressure and back pain", and worry she may need to be on bedrest.  Duration of problem: Current pregnancy; Severity of problem: mild   Patient and/or Family's Strengths/Protective Factors: Social connections, Social and Emotional competence, Concrete supports in place (healthy food, safe environments, etc.), Sense of purpose and Physical Health (exercise, healthy diet, medication compliance, etc.)  Goals Addressed: Patient will: 1.  Demonstrate  ability to: Increase healthy adjustment to current life circumstances  Progress towards Goals: Ongoing  Interventions: Interventions utilized:  Supportive Counseling Standardized Assessments completed: Not Needed  Patient and/or Family Response: Pt agrees with treatment plan  Assessment: Patient currently experiencing ADHD, Bipolar 2 disorder, as previously diagnosed by psychiatry.   Patient may benefit from supportive counseling today.  Plan: 1. Follow up with behavioral health clinician on : One month 2. Behavioral recommendations:  -Discuss medical concerns at upcoming medical appointment on 12/31/20 -Continue taking prenatal vitamin daily -Continue using self-coping strategies to manage current stress (daily schedule, humor, limiting soda, etc.) 3. Referral(s): Integrated [employer] (In Clinic)  I discussed the assessment and treatment plan with the patient and/or parent/guardian. They were provided an opportunity to ask questions and all were answered. They agreed with the plan and demonstrated an understanding of the instructions.   They were advised to call back or seek an in-person evaluation if the symptoms worsen or if the condition fails to improve as anticipated.  Rae Lips, LCSW   Depression screen [facility] 2/9 12/02/2020 11/03/2020 10/06/2020 09/24/2020 05/01/2017  Decreased Interest 0 0 0 1 0  Down, Depressed, Hopeless 0 0 0 0 0  PHQ - 2 Score 0 0 0 1 0  Altered sleeping 1 2 1 1  0  Tired, decreased energy 1 2 1 3 3   Change in appetite 0 0 1 3 2   Feeling bad or failure about yourself  0 0 0 0 0  Trouble concentrating 0 1 0 1 0  Moving slowly or fidgety/restless 0 0 0 3 0  Suicidal thoughts 0 0 0 0 0  PHQ-9 Score 2 5 3 12 5    GAD 7 : Generalized Anxiety Score 12/02/2020 11/03/2020 10/06/2020 09/24/2020  Nervous, Anxious, on Edge 0 0 0 0  Control/stop worrying 0 0 0 0  Worry too much - different things 0 0 0 0  Trouble relaxing 0 0 0 0  Restless 0 0  1 3  Easily annoyed or irritable 0 0 0 1  Afraid - awful might happen 0 0 0 0  Total GAD 7 Score 0 0 1 4

## 2020-12-29 ENCOUNTER — Ambulatory Visit: Payer: Medicaid Other | Admitting: Clinical

## 2020-12-29 DIAGNOSIS — F909 Attention-deficit hyperactivity disorder, unspecified type: Secondary | ICD-10-CM

## 2020-12-29 DIAGNOSIS — F3181 Bipolar II disorder: Secondary | ICD-10-CM

## 2020-12-29 NOTE — Patient Instructions (Addendum)
[facility] for [facility] [place], [place] [address] [phone] (main office) [phone] ([place] office)  High-Risk Pregnancy Support  KeepEmCookin.com  Www.keepemcookin.com   Sidelines: High-Risk Pregnancy Support Www.sidelines.org      BRAINSTORMING  Develop a Plan Goals: . Provide a way to start conversation about your new life with a baby . Assist parents in recognizing and using resources within their reach . Help pave the way before birth for an easier period of transition afterwards.  Make a list of the following information to keep in a central location: . Full name of Mom and Partner: _____________________________________________ . 1 full name and Date of Birth: ___________________________________________ . Home Address: ___________________________________________________________ ________________________________________________________________________ . Home Phone: ____________________________________________________________ . Parents' cell numbers: _____________________________________________________ ________________________________________________________________________ . Name and contact info for OB: ______________________________________________ . Name and contact info for Pediatrician:________________________________________ . Contact info for Lactation Consultants: ________________________________________  REST and SLEEP *You each need at least 4-5 hours of uninterrupted sleep every day. Write specific names and contact information.* . How are you going to rest in the postpartum period? While partner's home? When partner returns to work? When you both return to work? Marland Kitchen Where will your baby sleep? Marland Kitchen Who is available to help during the day? Evening? Night? . Who could move in for a period to help support you? Marland Kitchen What are some ideas to help you get enough  sleep? __________________________________________________________________________________________________________________________________________________________________________________________________________________________________________ NUTRITIOUS FOOD AND DRINK *Plan for meals before your baby is born so you can have healthy food to eat during the immediate postpartum period.* . Who will look after breakfast? Lunch? Dinner? List names and contact information. Brainstorm quick, healthy ideas for each meal. . What can you do before baby is born to prepare meals for the postpartum period? . How can others help you with meals? Marland Kitchen Which grocery stores provide online shopping and delivery? Marland Kitchen Which restaurants offer take-out or delivery options? ______________________________________________________________________________________________________________________________________________________________________________________________________________________________________________________________________________________________________________________________________________________________________________________________________  CARE FOR MOM *It's important that mom is cared for and pampered in the postpartum period. Remember, the most important ways new mothers need care are: sleep, nutrition, gentle exercise, and time off.* . Who can come take care of mom during this period? Make a list of people with their contact information. . List some activities that make you feel cared for, rested, and energized? Who can make sure you have opportunities to do these things? . Does mom have a space of her very own within your home that's just for her? Make a "[facility]" where she can be comfortable, rest, and renew herself  daily. ______________________________________________________________________________________________________________________________________________________________________________________________________________________________________________________________________________________________________________________________________________________________________________________________________    CARE FOR AND FEEDING BABY *Knowledgeable and encouraging people will offer the best support with regard to feeding your baby.* . Educate yourself and choose the best feeding option for your baby. . Make a list of people who will guide, support, and be a resource for you as your care for and feed your baby. (Friends that have breastfed or are currently breastfeeding, lactation consultants, breastfeeding support groups, etc.) . Consider a postpartum doula. (These websites can give you information: dona.org & BuyingShow.es) . Seek out local breastfeeding resources like the breastfeeding support group at [employer] or [employer]. ______________________________________________________________________________________________________________________________________________________________________________________________________________________________________________________________________________________________________________________________________________________________________________________________________  Verner Chol AND ERRANDS . Who can help with a thorough cleaning before baby is born? . Make a list of people who will help with housekeeping and chores, like laundry, light cleaning, dishes, bathrooms, etc. . Who can run some errands for you? Marland Kitchen What can you do to make sure you  are stocked with basic supplies before baby is born? . Who is going to do the  shopping? ______________________________________________________________________________________________________________________________________________________________________________________________________________________________________________________________________________________________________________________________________________________________________________________________________     Family Adjustment *Nurture yourselves.it helps parents be more loving and allows for better bonding with their child.* . What sorts of things do you and partner enjoy doing together? Which activities help you to connect and strengthen your relationship? Make a list of those things. Make a list of people whom you trust to care for your baby so you can have some time together as a couple. . What types of things help partner feel connected to Mom? Make a list. . What needs will partner have in order to bond with baby? . Other children? Who will care for them when you go into labor and while you are in the hospital? . Think about what the needs of your older children might be. Who can help you meet those needs? In what ways are you helping them prepare for bringing baby home? List some specific strategies you have for family adjustment. _______________________________________________________________________________________________________________________________________________________________________________________________________________________________________________________________________________________________________________________________________________  SUPPORT *Someone who can empathize with experiences normalizes your problems and makes them more bearable.* . Make a list of other friends, neighbors, and/or co-workers you know with infants (and small children, if applicable) with whom you can connect. . Make a list of local or online support groups, mom groups, etc. in which you can be  involved. ______________________________________________________________________________________________________________________________________________________________________________________________________________________________________________________________________________________________________________________________________________________________________________________________________  Childcare Plans . Investigate and plan for childcare if mom is returning to work. . Talk about mom's concerns about her transition back to work. . Talk about partner's concerns regarding this transition.  Mental Health *Your mental health is one of the highest priorities for a pregnant or postpartum mom.* . 1 in 5 women experience anxiety and/or depression from the time of conception through the first year after birth. . Postpartum Mood Disorders are the #1 complication of pregnancy and childbirth and the suffering experienced by these mothers is not necessary! These illnesses are temporary and respond well to treatment, which often includes self-care, social support, talk therapy, and medication when needed. . Women experiencing anxiety and depression often say things like: "I'm supposed to be happy.why do I feel so sad?", "Why can't I snap out of it?", "I'm having thoughts that scare me." . There is no need to be embarrassed if you are feeling these symptoms: o Overwhelmed, anxious, angry, sad, guilty, irritable, hopeless, exhausted but can't sleep o You are NOT alone. You are NOT to blame. With help, you WILL be well. . Where can I find help? Medical professionals such as your OB, midwife, gynecologist, family practitioner, primary care provider, pediatrician, or mental health providers; [facility] support groups: Feelings After Birth, Breastfeeding Support Group, Baby and Me Group, and Fit 4 Two exercise classes. . You have permission to ask for help. It will confirm your feelings, validate your  experiences, share/learn coping strategies, and gain support and encouragement as you heal. You are important! BRAINSTORM . Make a list of local resources, including resources for mom and for partner. . Identify support groups. . Identify people to call late at night - include names and contact info. . Talk with partner about perinatal mood and anxiety disorders. . Talk with your OB, midwife, and doula about baby blues and about perinatal mood and anxiety disorders. . Talk with your pediatrician about perinatal mood and anxiety disorders.   Support & Sanity Savers   What do you really need?  . Basics . In preparing for a new baby, many  expectant parents spend hours shopping for baby clothes, decorating the nursery, and deciding which car seat to buy. Yet most don't think much about what the reality of parenting a newborn will be like, and what they need to make it through that. So, here is the advice of experienced parents. We know you'll read this, and think "they're exaggerating, I don't really need that." Just trust [place] on these, OK? Plan for all of this, and if it turns out you don't need it, come back and teach [place] how you did it!  Satira Anis (Once baby's survival needs are met, make sure you attend to your own survival needs!) . Sleep . An average newborn sleeps 16-18 hours per day, over 6-7 sleep periods, rarely more than three hours at a time. It is normal and healthy for a newborn to wake throughout the night... but really hard on parents!! . Naps. Prioritize sleep above any responsibilities like: cleaning house, visiting friends, running errands, etc.  Sleep whenever baby sleeps. If you can't nap, at least have restful times when baby eats. The more rest you get, the more patient you will be, the more emotionally stable, and better at solving problems.  . Food . You may not have realized it would be difficult to eat when you have a newborn. Yet, when we talk to . countless new  parents, they say things like "it may be 2:00 pm when I realize I haven't had breakfast yet." Or "every time we sit down to dinner, baby needs to eat, and my food gets cold, so I don't bother to eat it." . Finger food. Before your baby is born, stock up with one months' worth of food that: 1) you can eat with one hand while holding a baby, 2) doesn't need to be prepped, 3) is good hot or cold, 4) doesn't spoil when left out for a few hours, and 5) you like to eat. Think about: nuts, dried fruit, Clif bars, pretzels, jerky, gogurt, baby carrots, apples, bananas, crackers, cheez-n-crackers, string cheese, hot pockets or frozen burritos to microwave, garden burgers and breakfast pastries to put in the toaster, yogurt drinks, etc. . Restaurant Menus. Make lists of your favorite restaurants & menu items. When family/friends want to help, you can give specific information without much thought. They can either bring you the food or send gift cards for just the right meals. Rosaura Carpenter Meals.  Take some time to make a few meals to put in the freezer ahead of time.  Easy to freeze meals can be anything such as soup, lasagna, chicken pie, or spaghetti sauce. . Set up a Meal Schedule.  Ask friends and family to sign up to bring you meals during the first few weeks of being home. (It can be passed around at baby showers!) You have no idea how helpful this will be until you are in the throes of parenting.  https://hamilton-[email or web address] is a great website to check out. . Emotional Support . Know who to call when you're stressed out. Parenting a newborn is very challenging work. There are times when it totally overwhelms your normal coping abilities. EVERY NEW PARENT NEEDS TO HAVE A PLAN FOR WHO TO CALL WHEN THEY JUST CAN'T COPE ANY MORE. (And it has to be someone other than the baby's other parent!) Before your baby is born, come up with at least one person you can call for support - write their phone number down and post it on the  refrigerator. Marland Kitchen  Anxiety & Sadness. Baby blues are normal after pregnancy; however, there are more severe types of anxiety & sadness which can occur and should not be ignored.  They are always treatable, but you have to take the first step by reaching out for help. [facility] offers a "Mom Talk" group which meets every Tuesday from 10 am - 11 am.  This group is for new moms who need support and connection after their babies are born.  Call [phone].  Marland Kitchen Really, Really Helpful (Plan for them! Make sure these happen often!!) . Physical Support with Taking Care of Yourselves . Asking friends and family. Before your baby is born, set up a schedule of people who can come and visit and help out (or ask a friend to schedule for you). Any time someone says "let me know what I can do to help," sign them up for a day. When they get there, their job is not to take care of the baby (that's your job and your joy). Their job is to take care of you!  . Postpartum doulas. If you don't have anyone you can call on for support, look into postpartum doulas:  professionals at helping parents with caring for baby, caring for themselves, getting breastfeeding started, and helping with household tasks. www.padanc.org is a helpful website for learning about doulas in our area. . Peer Support / Parent Groups . Why: One of the greatest ideas for new parents is to be around other new parents. Parent groups give you a chance to share and listen to others who are going through the same season of life, get a sense of what is normal infant development by watching several babies learn and grow, share your stories of triumph and struggles with empathetic ears, and forgive your own mistakes when you realize all parents are learning by trial and error. . Where to find: There are many places you can meet other new parents throughout our community.  [facility] offers the following classes for new moms and their little ones:  Baby  and Me (Birth to [place]) and Breastfeeding Support Group. Go to www.conehealthybaby.com or call [phone] for more information. . Time for your Relationship . It's easy to get so caught up in meeting baby's immediate needs that it's hard to find time to connect with your partner, and meet the needs of your relationship. It's also easy to forget what "quality time with your partner" actually looks like. If you take your baby on a date, you'd be amazed how much of your couple time is spent feeding the baby, diapering the baby, admiring the baby, and talking about the baby. . Dating: Try to take time for just the two of you. Babysitter tip: Sometimes when moms are breastfeeding a newborn, they find it hard to figure out how to schedule outings around baby's unpredictable feeding schedules. Have the babysitter come for a three hour period. When she comes over, if baby has just eaten, you can leave right away, and come back in two hours. If baby hasn't fed recently, you start the date at home. Once baby gets hungry and gets a good feeding in, you can head out for the rest of your date time. . Date Nights at Home: If you can't get out, at least set aside one evening a week to prioritize your relationship: whenever baby dozes off or doesn't have any immediate needs, spend a little time focusing on each other. . Potential conflicts: The main relationship conflicts that  come up for new parents are: issues related to sexuality, financial stresses, a feeling of an unfair division of household tasks, and conflicts in parenting styles. The more you can work on these issues before baby arrives, the better!  Clint Guy and Frills (Don't forget these. and don't feel guilty for indulging in them!) . Everyone has something in life that is a fun little treat that they do just for themselves. It may be: reading the morning paper, or going for a daily jog, or having coffee with a friend once a week, or going to a movie on Friday  nights, or fine chocolates, or bubble baths, or curling up with a good book. . Unless you do fun things for yourself every now and then, it's hard to have the energy for fun with your baby. Whatever your "special" treats are, make sure you find a way to continue to indulge in them after your baby is born. These special moments can recharge you, and allow you to return to baby with a new joy   PERINATAL MOOD DISORDERS: [place]   Emergency and Crisis Resources:  If you are an imminent risk to self or others, are experiencing intense personal distress, and/or have noticed significant changes in activities of daily living, call:  . 911 . [facility]: [phone] . Mobile Crisis: ([phone] . National Suicide Hotline: 817-374-0219 Or visit the following crisis centers: . Local Emergency Departments . [facility]: [address], [place]. Hours: 8:30AM-5PM. Insurance Accepted: Medicaid, Medicare, and Uninsured.  Marland Kitchen [facility]  [address], [place] Mon-Friday 8am-3pm  [phone]                                                                                    Non-Crisis Resources: To identify specific providers that are covered by your insurance, contact your insurance company or local agencies: [place]: [phone] [facility]: [phone] [place]: [phone] Postpartum Support International- [place] 1-(407)350-2565                                                      Outpatient therapy and medication management providers:  [facility] [phone] Hours: 9AM-5PM  Insurance Accepted: Alben Spittle, Lorella Nimrod, Freddrick March, [place], Medicare  [facility] ([place]) [phone] Hours: 8AM-5PM  nsurance Accepted: All insurances EXCEPT AARP, [place], [place], and  [place]: [phone]             Hours: 8AM-8PM Insurance Accepted: Cristal Ford, Freddrick March, [place], Medicare, [id number] [facility]: [phone] Hours: 8:30AM-7PM Insurance Accepted: Cristal Ford, Medicaid, Medicare, Tricare, [employer] Counseling:  [place] Accepted:  Holland Falling, Lorella Nimrod, [facility], [place], [facility] [phone] Hours: 9AM-5:30PM Insurance Accepted: Alben Spittle, [place], [place], and Medicaid, Medicare,  [facility]:  [phone] Hours: 9am-5pm Insurance Accepted: BCBS; they do not accept Medicaid/Medicare [facility] [place]: [phone] Hours: 9am-9pm Insurance Accepted: All major insurance including Medicaid and Medicare [facility]: [phone] Hours: 9AM-5:30PM Insurance Accepted: All insurances EXCEPT Medicaid and Medicare. [facility]: [place]: [phone] [place]:  [place] (support for children in the NICU and/or with special needs), [employer]: [phone]                                                                                     Online Resources: Postpartum Support International: http://jones-[email or web address]/  800-944-4PPD 2Moms Supporting Moms:  www.momssupportingmoms.net

## 2020-12-31 ENCOUNTER — Ambulatory Visit (INDEPENDENT_AMBULATORY_CARE_PROVIDER_SITE_OTHER): Payer: Medicaid Other | Admitting: Student

## 2020-12-31 ENCOUNTER — Other Ambulatory Visit: Payer: Self-pay

## 2020-12-31 VITALS — BP 105/65 | HR 91 | Wt 140.5 lb

## 2020-12-31 DIAGNOSIS — Z3492 Encounter for supervision of normal pregnancy, unspecified, second trimester: Secondary | ICD-10-CM

## 2020-12-31 DIAGNOSIS — Z3A23 23 weeks gestation of pregnancy: Secondary | ICD-10-CM

## 2020-12-31 NOTE — Patient Instructions (Signed)
-recommend heat, hydrotherapy, pregnancy support belt, yoga online -    Oral Glucose Tolerance Test During Pregnancy Why am I having this test? The oral glucose tolerance test (OGTT) is done to check how your body processes blood sugar (glucose). This is one of several tests used to diagnose diabetes that develops during pregnancy (gestational diabetes mellitus). Gestational diabetes is a short-term form of diabetes that some women develop while they are pregnant. It usually occurs during the second trimester of pregnancy and goes away after delivery. Testing, or screening, for gestational diabetes usually occurs at weeks 24-28 of pregnancy. You may have the OGTT test after having a 1-hour glucose screening test if the results from that test indicate that you may have gestational diabetes. This test may also be needed if:  You have a history of gestational diabetes.  There is a history of giving birth to very large babies or of losing pregnancies (having stillbirths).  You have signs and symptoms of diabetes, such as: ? Changes in your eyesight. ? Tingling or numbness in your hands or feet. ? Changes in hunger, thirst, and urination, and these are not explained by your pregnancy. What is being tested? This test measures the amount of glucose in your blood at different times during a period of 3 hours. This shows how well your body can process glucose. What kind of sample is taken? Blood samples are required for this test. They are usually collected by inserting a needle into a blood vessel.   How do I prepare for this test?  For 3 days before your test, eat normally. Have plenty of carbohydrate-rich foods.  Follow instructions from your health care provider about: ? Eating or drinking restrictions on the day of the test. You may be asked not to eat or drink anything other than water (to fast) starting 8-10 hours before the test. ? Changing or stopping your regular medicines. Some  medicines may interfere with this test. Tell a health care provider about:  All medicines you are taking, including vitamins, herbs, eye drops, creams, and over-the-counter medicines.  Any blood disorders you have.  Any surgeries you have had.  Any medical conditions you have. What happens during the test? First, your blood glucose will be measured. This is referred to as your fasting blood glucose because you fasted before the test. Then, you will drink a glucose solution that contains a certain amount of glucose. Your blood glucose will be measured again 1, 2, and 3 hours after you drink the solution. This test takes about 3 hours to complete. You will need to stay at the testing location during this time. During the testing period:  Do not eat or drink anything other than the glucose solution.  Do not exercise.  Do not use any products that contain nicotine or tobacco, such as cigarettes, e-cigarettes, and chewing tobacco. These can affect your test results. If you need help quitting, ask your health care provider. The testing procedure may vary among health care providers and hospitals. How are the results reported? Your results will be reported as milligrams of glucose per deciliter of blood (mg/dL) or millimoles per liter (mmol/L). There is more than one source for screening and diagnosis reference values used to diagnose gestational diabetes. Your health care provider will compare your results to normal values that were established after testing a large group of people (reference values). Reference values may vary among labs and hospitals. For this test (Carpenter-Coustan), reference values are:  Fasting: 95 mg/dL (  5.3 mmol/L).  1 hour: 180 mg/dL (62.3 mmol/L).  2 hour: 155 mg/dL (8.6 mmol/L).  3 hour: 140 mg/dL (7.8 mmol/L). What do the results mean? Results below the reference values are considered normal. If two or more of your blood glucose levels are at or above the  reference values, you may be diagnosed with gestational diabetes. If only one level is high, your health care provider may suggest repeat testing or other tests to confirm a diagnosis. Talk with your health care provider about what your results mean. Questions to ask your health care provider Ask your health care provider, or the department that is doing the test:  When will my results be ready?  How will I get my results?  What are my treatment options?  What other tests do I need?  What are my next steps? Summary  The oral glucose tolerance test (OGTT) is one of several tests used to diagnose diabetes that develops during pregnancy (gestational diabetes mellitus). Gestational diabetes is a short-term form of diabetes that some women develop while they are pregnant.  You may have the OGTT test after having a 1-hour glucose screening test if the results from that test show that you may have gestational diabetes. You may also have this test if you have any symptoms or risk factors for this type of diabetes.  Talk with your health care provider about what your results mean. This information is not intended to replace advice given to you by your health care provider. Make sure you discuss any questions you have with your health care provider. Document Revised: 01/09/2020 Document Reviewed: 01/09/2020 Elsevier Patient Education  2021 [employer].

## 2020-12-31 NOTE — Progress Notes (Signed)
   PRENATAL VISIT NOTE  Subjective:  Allison Shaw is a 25 y.o. G2P1001 at [redacted]w[redacted]d being seen today for ongoing prenatal care.  She is currently monitored for the following issues for this low-risk pregnancy and has Bipolar 2 disorder (HCC); Attention deficit hyperactivity disorder (ADHD); Supervision of low-risk pregnancy; History of vacuum extraction assisted delivery; Rubella non-immune status, antepartum; and Hx of fetal anomaly in prior pregnancy, currently pregnant on their problem list.  Patient reports pelvic pain, dizzy, nausea.  SHe reports getting dizzy from vaccuuming. .  Contractions: Irritability.  .  Movement: Present. Denies leaking of fluid.   The following portions of the patient's history were reviewed and updated as appropriate: allergies, current medications, past family history, past medical history, past social history, past surgical history and problem list.   Objective:   Vitals:   12/31/20 0902  BP: 105/65  Pulse: 91  Weight: 140 lb 8 oz (63.7 kg)    Fetal Status: Fetal Heart Rate (bpm): 154 Fundal Height: 28 cm Movement: Present     General:  Alert, oriented and cooperative. Patient is in no acute distress.  Skin: Skin is warm and dry. No rash noted.   Cardiovascular: Normal heart rate noted  Respiratory: Normal respiratory effort, no problems with respiration noted  Abdomen: Soft, gravid, appropriate for gestational age.  Pain/Pressure: Present     Pelvic: Cervical exam deferred        Extremities: Normal range of motion.  Edema: None  Mental Status: Normal mood and affect. Normal behavior. Normal judgment and thought content.   Assessment and Plan:  Pregnancy: G2P1001 at [redacted]w[redacted]d 1. Encounter for supervision of low-risk pregnancy in second trimester -will do CBC today -recommended heat, water immersion, yoga for pelvic  -use pregnancy support belt -gave patient address for fetal echo -discussed IOL at 39 weeks -discussed delivery, had vacuum and second  degree x 2; patient would like to have IOL at 39 weeks   Preterm labor symptoms and general obstetric precautions including but not limited to vaginal bleeding, contractions, leaking of fluid and fetal movement were reviewed in detail with the patient. Please refer to After Visit Summary for other counseling recommendations.   Return in about 4 weeks (around 01/28/2021), or LROB with 2 hour GTT.  Future Appointments  Date Time Provider Department Center  01/26/2021  1:15 PM [facility]    Charlesetta Garibaldi [place], [place]

## 2020-12-31 NOTE — Progress Notes (Signed)
Patient complains of 2 weeks of braxton hicks along with pain in lower abdomen lower back, pain in "upper part" of rib cage and has became light headed.. Denies vaginal bleeding or abnormal discharge. States that she only feel baby move when she is sitting down

## 2021-01-01 LAB — CBC
Hematocrit: 35.4 % (ref 34.0–46.6)
Hemoglobin: 11.4 g/dL (ref 11.1–15.9)
MCH: 29.9 pg (ref 26.6–33.0)
MCHC: 32.2 g/dL (ref 31.5–35.7)
MCV: 93 fL (ref 79–97)
Platelets: 211 10*3/uL (ref 150–450)
RBC: 3.81 x10E6/uL (ref 3.77–5.28)
RDW: 12.5 % (ref 11.7–15.4)
WBC: 10.7 10*3/uL (ref 3.4–10.8)

## 2021-01-06 ENCOUNTER — Inpatient Hospital Stay (HOSPITAL_COMMUNITY)
Admission: AD | Admit: 2021-01-06 | Discharge: 2021-01-06 | Disposition: A | Discharge status: Home / Self Care | Payer: Medicaid Other | Attending: Obstetrics & Gynecology | Admitting: Obstetrics & Gynecology

## 2021-01-06 ENCOUNTER — Encounter (HOSPITAL_COMMUNITY): Payer: Self-pay | Admitting: Obstetrics & Gynecology

## 2021-01-06 DIAGNOSIS — F909 Attention-deficit hyperactivity disorder, unspecified type: Secondary | ICD-10-CM | POA: Insufficient documentation

## 2021-01-06 DIAGNOSIS — K5900 Constipation, unspecified: Secondary | ICD-10-CM | POA: Diagnosis not present

## 2021-01-06 DIAGNOSIS — Z3A24 24 weeks gestation of pregnancy: Secondary | ICD-10-CM | POA: Diagnosis not present

## 2021-01-06 DIAGNOSIS — O479 False labor, unspecified: Secondary | ICD-10-CM

## 2021-01-06 DIAGNOSIS — O99342 Other mental disorders complicating pregnancy, second trimester: Secondary | ICD-10-CM | POA: Insufficient documentation

## 2021-01-06 DIAGNOSIS — O4702 False labor before 37 completed weeks of gestation, second trimester: Secondary | ICD-10-CM

## 2021-01-06 DIAGNOSIS — Z87891 Personal history of nicotine dependence: Secondary | ICD-10-CM | POA: Diagnosis not present

## 2021-01-06 DIAGNOSIS — O99612 Diseases of the digestive system complicating pregnancy, second trimester: Secondary | ICD-10-CM | POA: Diagnosis not present

## 2021-01-06 DIAGNOSIS — F319 Bipolar disorder, unspecified: Secondary | ICD-10-CM | POA: Diagnosis not present

## 2021-01-06 LAB — URINALYSIS, ROUTINE W REFLEX MICROSCOPIC
Bacteria, UA: NONE SEEN
Bilirubin Urine: NEGATIVE
Glucose, UA: NEGATIVE mg/dL
Hgb urine dipstick: NEGATIVE
Ketones, ur: NEGATIVE mg/dL
Nitrite: NEGATIVE
Protein, ur: NEGATIVE mg/dL
Specific Gravity, Urine: 1.026 (ref 1.005–1.030)
pH: 6 (ref 5.0–8.0)

## 2021-01-06 LAB — WET PREP, GENITAL
Clue Cells Wet Prep HPF POC: NONE SEEN
Sperm: NONE SEEN
Trich, Wet Prep: NONE SEEN
Yeast Wet Prep HPF POC: NONE SEEN

## 2021-01-06 MED ORDER — NIFEDIPINE 10 MG PO CAPS
10.0000 mg | ORAL_CAPSULE | ORAL | Status: AC
  Administered 2021-01-06 (×4): 10 mg via ORAL
  Filled 2021-01-06 (×4): qty 1

## 2021-01-06 NOTE — Discharge Instructions (Signed)
Preterm Labor The normal length of a pregnancy is 39-41 weeks. Preterm labor is when labor starts before 37 completed weeks of pregnancy. Babies who are born prematurely and survive may not be fully developed and may be at an increased risk for long-term problems such as cerebral palsy, developmental delays, and vision and hearing problems. Babies who are born too early may have problems soon after birth. Problems may include regulating blood sugar, body temperature, heart rate, and breathing rate. These babies often have trouble with feeding. The risk of having problems is highest for babies who are born before 34 weeks of pregnancy. What are the causes? The exact cause of this condition is not known. What increases the risk? You are more likely to have preterm labor if you have certain risk factors that relate to your medical history, problems with present and past pregnancies, and lifestyle factors. Medical history  You have abnormalities of the uterus, including a short cervix.  You have STIs (sexually transmitted infections), or other infections of the urinary tract and the vagina.  You have chronic illnesses, such as blood clotting problems, diabetes, or high blood pressure.  You are overweight or underweight. Present and past pregnancies  You have had preterm labor before.  You are pregnant with twins or other multiples.  You have been diagnosed with a condition in which the placenta covers your cervix (placenta previa).  You waited less than 6 months between giving birth and becoming pregnant again.  Your unborn baby has some abnormalities.  You have vaginal bleeding during pregnancy.  You became pregnant through in vitro fertilization (IVF). Lifestyle and environmental factors  You use tobacco products.  You drink alcohol.  You use street drugs.  You have stress and no social support.  You experience domestic violence.  You are exposed to certain chemicals or  environmental pollutants. Other factors  You are younger than age 17 or older than age 35. What are the signs or symptoms? Symptoms of this condition include:  Cramps similar to those that can happen during a menstrual period. The cramps may happen with diarrhea.  Pain in the abdomen or lower back.  Regular contractions that may feel like tightening of the abdomen.  A feeling of increased pressure in the pelvis.  Increased watery or bloody mucus discharge from the vagina.  Water breaking (ruptured amniotic sac). How is this diagnosed? This condition is diagnosed based on:  Your medical history and a physical exam.  A pelvic exam.  An ultrasound.  Monitoring your uterus for contractions.  Other tests, including: ? A swab of the cervix to check for a chemical called fetal fibronectin. ? Urine tests. How is this treated? Treatment for this condition depends on the length of your pregnancy, your condition, and the health of your baby. Treatment may include:  Taking medicines, such as: ? Hormone medicines. These may be given early in pregnancy to help support the pregnancy. ? Medicines to stop contractions. ? Medicines to help mature the baby's lungs. These may be prescribed if the risk of delivery is high. ? Medicines to prevent your baby from developing cerebral palsy.  Bed rest. If the labor happens before 34 weeks of pregnancy, you may need to stay in the hospital.  Delivery of the baby. Follow these instructions at home:  Do not use any products that contain nicotine or tobacco, such as cigarettes, e-cigarettes, and chewing tobacco. If you need help quitting, ask your health care provider.  Do not drink alcohol.    Take over-the-counter and prescription medicines only as told by your health care provider.  Rest as told by your health care provider.  Return to your normal activities as told by your health care provider. Ask your health care provider what activities  are safe for you.  Keep all follow-up visits as told by your health care provider. This is important.   How is this prevented? To increase your chance of having a full-term pregnancy:  Do not use street drugs or medicines that have not been prescribed to you during your pregnancy.  Talk with your health care provider before taking any herbal supplements, even if you have been taking them regularly.  Make sure you gain a healthy amount of weight during your pregnancy.  Watch for infection. If you think that you might have an infection, get it checked right away. Symptoms of infection may include: ? Fever. ? Abnormal vaginal discharge or discharge that smells bad. ? Pain or burning with urination. ? Needing to urinate urgently. ? Frequently urinating or passing small amounts of urine frequently. ? Blood in your urine. ? Urine that smells bad or unusual.  Tell your health care provider if you have had preterm labor before. Contact a health care provider if:  You think you are going into preterm labor.  You have signs or symptoms of preterm labor.  You have symptoms of infection. Get help right away if:  You are having regular, painful contractions every 5 minutes or less.  Your water breaks. Summary  Preterm labor is labor that starts before you reach 37 weeks of pregnancy.  Delivering your baby early increases your baby's risk of developing lifelong problems.  The exact cause of preterm labor is unknown. However, having an abnormal uterus, an STI (sexually transmitted infection), or vaginal bleeding during pregnancy increases your risk for preterm labor.  Keep all follow-up visits as told by your health care provider. This is important.  Contact a health care provider if you have signs or symptoms of preterm labor. This information is not intended to replace advice given to you by your health care provider. Make sure you discuss any questions you have with your health care  provider. Document Revised: 09/03/2019 Document Reviewed: 09/03/2019 Elsevier Patient Education  2021 Elsevier Inc.  

## 2021-01-06 NOTE — MAU Note (Signed)
Told my OB I have been having braxton hicks ctxs and was told to do preg yoga, eat more protein and use support belt. Have done all the above and now ctxs are stronger. Having a lot of dizziness to the point I have to sit and cook, etc. Stomach is getting tight at times.

## 2021-01-06 NOTE — MAU Provider Note (Signed)
Chief Complaint:  No chief complaint on file.   None    HPI: Allison Shaw is a 25 y.o. G2P1001 at 80w2dwho presents to maternity admissions reporting contractions. Patient reports she has been having braxton hicks contractions for the past 2 weeks. Reports she told her OB and was told to try pregnancy yoga, use a maternity belt, take a bath and increase protein, which she has been doing. She reports today the pain got worse which is what made her come in to be evaluated. Patient reports that since 1pm today she has felt 3-4 contractions total. Last had intercourse last night. She denies LOF or vaginal bleeding. Endorses active fetal movement.   Of note, patient also reports intermittent dizziness. Discussed with OB and was told she was not anemic. Patient reports she has had 1 8oz glass of tea, 2 cans of soda, and 2 water bottles today.    Pregnancy Course:   Past Medical History:  Diagnosis Date  . ADHD (attention deficit hyperactivity disorder)   . Bipolar 1 disorder ([place]   . Depression   . Mental disorder    OB History  Gravida Para Term Preterm AB Living  _0 SAB IAB Ectopic Multiple Live Births        0 1    # Outcome Date GA Lbr Len/2nd Weight Sex Delivery Anes PTL Lv  2 Current           1 Term 05/19/16 430w2d 02:15 3657 g F Vag-Vacuum EPI  LIV     Birth Comments: Induced for Postdates with cytotec, foley bulb. Developed chorioamniontis; went home with foley   Past Surgical History:  Procedure Laterality Date  . 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 Mental illness Mother   . Depression Mother   . Bipolar disorder Mother   . Diabetes Mother   . ADD / ADHD Father    Social History   Tobacco Use  . Smoking status: Former Smoker    Types: Cigarettes  . Smokeless tobacco: Never Used  . Tobacco comment: stopped with pregnancy; around others who smoke  Vaping Use  . Vaping Use: Former  .  Quit date: 02/22/2020  Substance Use Topics  . Alcohol use: Not Currently    Comment: occasionally; stopped when found out pregnant- drank alot on New Years   . Drug use: No   Allergies  Allergen Reactions  . Latex Itching and Other (See Comments)    Reaction:  Burning   . Fish Allergy Rash   Medications Prior to Admission  Medication Sig Dispense Refill Last Dose  . bisacodyl (DULCOLAX) 5 MG EC tablet Take 5 mg by mouth daily as needed for moderate constipation.   Past Month at Unknown time  . docusate sodium (COLACE) 100 MG capsule Take 1 capsule (100 mg total) by mouth 2 (two) times daily. 30 capsule 0 Past Month at Unknown time  . Blood Pressure Monitoring (BLOOD PRESSURE KIT) DEVI 1 Device by Does not apply route as needed. 1 each 0   . pantoprazole (PROTONIX) 40 MG tablet Take 1 tablet (40 mg total) by mouth daily. 30 tablet 0   . Prenatal Vit-Fe Fumarate-FA (PRENATAL VITAMINS PO) Take 1 tablet by mouth daily.     . promethazine (PHENERGAN) 25 MG tablet Take 1 tablet (25 mg total) by mouth every 6 (six) hours as  needed for nausea or vomiting. 30 tablet 0     I have reviewed patient's Past Medical Hx, Surgical Hx, Family Hx, Social Hx, medications and allergies.   ROS:  Review of Systems  Constitutional: Negative.   Respiratory: Negative.   Cardiovascular: Negative.   Gastrointestinal: Positive for abdominal pain.  Genitourinary: Negative.   Musculoskeletal: Negative.   Neurological: Positive for dizziness.  Psychiatric/Behavioral: Negative.     Physical Exam   Patient Vitals for the past 24 hrs:  BP Temp Pulse Resp SpO2 Height Weight  01/06/21 2255 (!) 118/50 -- (!) 105 -- -- -- --  01/06/21 2238 127/60 -- (!) 103 -- -- -- --  01/06/21 2237 127/60 -- -- -- -- -- --  01/06/21 2215 (!) 111/58 -- 92 -- -- -- --  01/06/21 2214 (!) 111/58 -- -- -- -- -- --  01/06/21 2154 115/68 -- -- -- -- -- --  01/06/21 2133 120/66 -- -- -- -- -- --  01/06/21 2131 120/66 -- 88 -- --  -- --  01/06/21 2048 -- -- (!) 114 -- 100 % -- --  01/06/21 2045 113/69 -- -- -- -- -- --  01/06/21 2044 -- 98.2 F (36.8 C) -- 18 -- _0  (1.6 m) 65.3 kg   Constitutional: well-developed, well-nourished female in no acute distress.  Cardiovascular: normal rate Respiratory: normal effort GI: abd soft, non-tender, gravid MS: extremities nontender, no edema, normal ROM Neurologic: alert and oriented x 4, no focal neuro deficits  GU: neg CVAT. Pelvic: blind swabs obtained Cervix: FT/Thick     FHT: Baseline 145bpm, moderate variability, +10x10 accels, no decelerations Contractions: uterine irritability    Labs: Results for orders placed or performed during the hospital encounter of 01/06/21 (from the past 24 hour(s))  Urinalysis, Routine w reflex microscopic Urine, Clean Catch     Status: Abnormal   Collection Time: 01/06/21  8:52 PM  Result Value Ref Range   Color, Urine YELLOW YELLOW   APPearance HAZY (A) CLEAR   Specific Gravity, Urine 1.026 1.005 - 1.030   pH 6.0 5.0 - 8.0   Glucose, UA NEGATIVE NEGATIVE mg/dL   Hgb urine dipstick NEGATIVE NEGATIVE   Bilirubin Urine NEGATIVE NEGATIVE   Ketones, ur NEGATIVE NEGATIVE mg/dL   Protein, ur NEGATIVE NEGATIVE mg/dL   Nitrite NEGATIVE NEGATIVE   Leukocytes,Ua SMALL (A) NEGATIVE   RBC / HPF 0-5 0 - 5 RBC/hpf   WBC, UA 6-10 0 - 5 WBC/hpf   Bacteria, UA NONE SEEN NONE SEEN   Squamous Epithelial / LPF 6-10 0 - 5   Mucus PRESENT   Wet prep, genital     Status: Abnormal   Collection Time: 01/06/21  9:25 PM  Result Value Ref Range   Yeast Wet Prep HPF POC NONE SEEN NONE SEEN   Trich, Wet Prep NONE SEEN NONE SEEN   Clue Cells Wet Prep HPF POC NONE SEEN NONE SEEN   WBC, Wet Prep HPF POC MANY (A) NONE SEEN   Sperm NONE SEEN     Imaging:  No results found.  MAU Course: Orders Placed This Encounter  Procedures  . Wet prep, genital  . Urinalysis, Routine w reflex microscopic Urine, Clean Catch  . Discharge patient   Meds  ordered this encounter  Medications  . NIFEdipine (PROCARDIA) capsule 10 mg    MDM: UA unremarkable Wet prep unremarkable, GC/CT pending FFN not collected given IC last night Cervix FT/Thick --> remains FT/Thick after 1.5hours  Procardia 35m q20 x4  ordered PO hydration On reassessment patient reports pain has completely resolved  Uterine irritability has resolved Recommend patient increase water intake at home  Assessment: 1. [redacted] weeks gestation of pregnancy   2. Braxton Hick's contraction     Plan: Discharge home in stable condition.  Strict precautions reviewed, including preterm labor precautions  Keep OB appointment as scheduled Return to MAU as needed for emergencies    Allergies as of 01/06/2021      Reactions   Latex Itching, Other (See Comments)   Reaction:  Burning    Fish Allergy Rash      Medication List    TAKE these medications   bisacodyl 5 MG EC tablet Commonly known as: DULCOLAX Take 5 mg by mouth daily as needed for moderate constipation.   Blood Pressure Kit Devi 1 Device by Does not apply route as needed.   docusate sodium 100 MG capsule Commonly known as: Colace Take 1 capsule (100 mg total) by mouth 2 (two) times daily.   pantoprazole 40 MG tablet Commonly known as: Protonix Take 1 tablet (40 mg total) by mouth daily.   PRENATAL VITAMINS PO Take 1 tablet by mouth daily.   promethazine 25 MG tablet Commonly known as: PHENERGAN Take 1 tablet (25 mg total) by mouth every 6 (six) hours as needed for nausea or vomiting.       Maryagnes Amos, MSN, CNM 01/06/2021 4:11 AM

## 2021-01-07 ENCOUNTER — Encounter (HOSPITAL_COMMUNITY): Payer: Self-pay | Admitting: Obstetrics & Gynecology

## 2021-01-07 ENCOUNTER — Inpatient Hospital Stay (HOSPITAL_COMMUNITY)
Admission: AD | Admit: 2021-01-07 | Discharge: 2021-01-07 | Disposition: A | Discharge status: Home / Self Care | Payer: Medicaid Other | Attending: Obstetrics & Gynecology | Admitting: Obstetrics & Gynecology

## 2021-01-07 ENCOUNTER — Other Ambulatory Visit: Payer: Self-pay

## 2021-01-07 DIAGNOSIS — O99342 Other mental disorders complicating pregnancy, second trimester: Secondary | ICD-10-CM | POA: Insufficient documentation

## 2021-01-07 DIAGNOSIS — Z3492 Encounter for supervision of normal pregnancy, unspecified, second trimester: Secondary | ICD-10-CM

## 2021-01-07 DIAGNOSIS — F909 Attention-deficit hyperactivity disorder, unspecified type: Secondary | ICD-10-CM | POA: Insufficient documentation

## 2021-01-07 DIAGNOSIS — K5900 Constipation, unspecified: Secondary | ICD-10-CM | POA: Diagnosis not present

## 2021-01-07 DIAGNOSIS — O479 False labor, unspecified: Secondary | ICD-10-CM

## 2021-01-07 DIAGNOSIS — O4702 False labor before 37 completed weeks of gestation, second trimester: Secondary | ICD-10-CM | POA: Diagnosis present

## 2021-01-07 DIAGNOSIS — F319 Bipolar disorder, unspecified: Secondary | ICD-10-CM | POA: Insufficient documentation

## 2021-01-07 DIAGNOSIS — Z87891 Personal history of nicotine dependence: Secondary | ICD-10-CM | POA: Diagnosis not present

## 2021-01-07 DIAGNOSIS — Z3A24 24 weeks gestation of pregnancy: Secondary | ICD-10-CM | POA: Diagnosis not present

## 2021-01-07 DIAGNOSIS — O99612 Diseases of the digestive system complicating pregnancy, second trimester: Secondary | ICD-10-CM | POA: Insufficient documentation

## 2021-01-07 DIAGNOSIS — Z3689 Encounter for other specified antenatal screening: Secondary | ICD-10-CM

## 2021-01-07 LAB — GC/CHLAMYDIA PROBE AMP (~~LOC~~) NOT AT ARMC
Chlamydia: NEGATIVE
Comment: NEGATIVE
Comment: NORMAL
Neisseria Gonorrhea: NEGATIVE

## 2021-01-07 NOTE — MAU Note (Signed)
PT SAYS SHE WAS HERE YESTERDAY - FOR UC'S.  SENT HOME WITH PLAN- FLUIDS AND REST.  BUT HAS HAD 6-7 UC'S TODAY THEN AT 8PM- SHE HAD A UC THAT MADE HER ABD BURN - STOPPED CAME HERE - NONE NOW.  [facility] WITH CLINIC

## 2021-01-07 NOTE — Discharge Instructions (Signed)
Safe Medications in Pregnancy    Acne: Benzoyl Peroxide Salicylic Acid  Backache/Headache: Tylenol: 2 regular strength every 4 hours OR              2 Extra strength every 6 hours  Colds/Coughs/Allergies: Benadryl (alcohol free) 25 mg every 6 hours as needed Breath right strips Claritin Cepacol throat lozenges Chloraseptic throat spray Cold-Eeze- up to three times per day Cough drops, alcohol free Flonase (by prescription only) Guaifenesin Mucinex Robitussin DM (plain only, alcohol free) Saline nasal spray/drops Sudafed (pseudoephedrine) & Actifed ** use only after [redacted] weeks gestation and if you do not have high blood pressure Tylenol Vicks Vaporub Zinc lozenges Zyrtec   Constipation: Colace Ducolax suppositories Fleet enema Glycerin suppositories Metamucil Milk of magnesia Miralax Senokot Smooth move tea  Diarrhea: Kaopectate Imodium A-D  *NO pepto Bismol  Hemorrhoids: Anusol Anusol HC Preparation H Tucks  Indigestion: Tums Maalox Mylanta Zantac  Pepcid  Insomnia: Benadryl (alcohol free) 25mg  every 6 hours as needed Tylenol PM Unisom, no Gelcaps  Leg Cramps: Tums MagGel  Nausea/Vomiting:  Bonine Dramamine Emetrol Ginger extract Sea bands Meclizine  Nausea medication to take during pregnancy:  Unisom (doxylamine succinate 25 mg tablets) Take one tablet daily at bedtime. If symptoms are not adequately controlled, the dose can be increased to a maximum recommended dose of two tablets daily (1/2 tablet in the morning, 1/2 tablet mid-afternoon and one at bedtime). Vitamin B6 100mg  tablets. Take one tablet twice a day (up to 200 mg per day).  Skin Rashes: Aveeno products Benadryl cream or 25mg  every 6 hours as needed Calamine Lotion 1% cortisone cream  Yeast infection: Gyne-lotrimin 7 Monistat 7   **If taking multiple medications, please check labels to avoid duplicating the same active ingredients **take  medication as directed on the label ** Do not exceed 4000 mg of tylenol in 24 hours **Do not take medications that contain aspirin or ibuprofen          You have constipation which is hard stools that are difficult to pass. It is important to have regular bowel movements every 1-3 days that are soft and easy to pass. Hard stools increase your risk of hemorrhoids and are very uncomfortable.   To prevent constipation you can increase the amount of fiber in your diet. Examples of foods with fiber are leafy greens, whole grain breads, oatmeal and other grains.  It is also important to drink at least eight 8oz glass of water everyday.   If you have not has a bowel movement in 4-5 days you made need to clean out your bowel.  This will have establish normal movement through your bowel.    Miralax Clean out  Take 8 capfuls of miralax in 64 oz of gatorade. You can use any fluid that appeals to you (gatorade, water, juice)  Continue to drink at least eight 8 oz glasses of water throughout the day  You can repeat with another 8 capfuls of miralax in 64 oz of gatorade if you are not having a large amount of stools  You will need to be at home and close to a bathroom for about 8 hours when you do the above as you may need to go to the bathroom frequently.   After you are cleaned out: - Start Colace100mg  twice daily - Start Miralax once daily - Start a daily fiber supplement like metamucil or citrucel - You can safely use enemas in pregnancy  - if you are having diarrhea you can  reduce to Colace once a day or miralax every other day or a 1/2 capful daily.         Preterm Labor The normal length of a pregnancy is 39-41 weeks. Preterm labor is when labor starts before 37 completed weeks of pregnancy. Babies who are born prematurely and survive may not be fully developed and may be at an increased risk for long-term problems such as cerebral palsy, developmental delays, and vision and  hearing problems. Babies who are born too early may have problems soon after birth. Problems may include regulating blood sugar, body temperature, heart rate, and breathing rate. These babies often have trouble with feeding. The risk of having problems is highest for babies who are born before 34 weeks of pregnancy. What are the causes? The exact cause of this condition is not known. What increases the risk? You are more likely to have preterm labor if you have certain risk factors that relate to your medical history, problems with present and past pregnancies, and lifestyle factors. Medical history  You have abnormalities of the uterus, including a short cervix.  You have STIs (sexually transmitted infections), or other infections of the urinary tract and the vagina.  You have chronic illnesses, such as blood clotting problems, diabetes, or high blood pressure.  You are overweight or underweight. Present and past pregnancies  You have had preterm labor before.  You are pregnant with twins or other multiples.  You have been diagnosed with a condition in which the placenta covers your cervix (placenta previa).  You waited less than 6 months between giving birth and becoming pregnant again.  Your unborn baby has some abnormalities.  You have vaginal bleeding during pregnancy.  You became pregnant through in vitro fertilization (IVF). Lifestyle and environmental factors  You use tobacco products.  You drink alcohol.  You use street drugs.  You have stress and no social support.  You experience domestic violence.  You are exposed to certain chemicals or environmental pollutants. Other factors  You are younger than age 66 or older than age 92. What are the signs or symptoms? Symptoms of this condition include:  Cramps similar to those that can happen during a menstrual period. The cramps may happen with diarrhea.  Pain in the abdomen or lower back.  Regular  contractions that may feel like tightening of the abdomen.  A feeling of increased pressure in the pelvis.  Increased watery or bloody mucus discharge from the vagina.  Water breaking (ruptured amniotic sac). How is this diagnosed? This condition is diagnosed based on:  Your medical history and a physical exam.  A pelvic exam.  An ultrasound.  Monitoring your uterus for contractions.  Other tests, including: ? A swab of the cervix to check for a chemical called fetal fibronectin. ? Urine tests. How is this treated? Treatment for this condition depends on the length of your pregnancy, your condition, and the health of your baby. Treatment may include:  Taking medicines, such as: ? Hormone medicines. These may be given early in pregnancy to help support the pregnancy. ? Medicines to stop contractions. ? Medicines to help mature the baby's lungs. These may be prescribed if the risk of delivery is high. ? Medicines to prevent your baby from developing cerebral palsy.  Bed rest. If the labor happens before 34 weeks of pregnancy, you may need to stay in the hospital.  Delivery of the baby. Follow these instructions at home:  Do not use any products that contain  nicotine or tobacco, such as cigarettes, e-cigarettes, and chewing tobacco. If you need help quitting, ask your health care provider.  Do not drink alcohol.  Take over-the-counter and prescription medicines only as told by your health care provider.  Rest as told by your health care provider.  Return to your normal activities as told by your health care provider. Ask your health care provider what activities are safe for you.  Keep all follow-up visits as told by your health care provider. This is important.   How is this prevented? To increase your chance of having a full-term pregnancy:  Do not use street drugs or medicines that have not been prescribed to you during your pregnancy.  Talk with your health care  provider before taking any herbal supplements, even if you have been taking them regularly.  Make sure you gain a healthy amount of weight during your pregnancy.  Watch for infection. If you think that you might have an infection, get it checked right away. Symptoms of infection may include: ? Fever. ? Abnormal vaginal discharge or discharge that smells bad. ? Pain or burning with urination. ? Needing to urinate urgently. ? Frequently urinating or passing small amounts of urine frequently. ? Blood in your urine. ? Urine that smells bad or unusual.  Tell your health care provider if you have had preterm labor before. Contact a health care provider if:  You think you are going into preterm labor.  You have signs or symptoms of preterm labor.  You have symptoms of infection. Get help right away if:  You are having regular, painful contractions every 5 minutes or less.  Your water breaks. Summary  Preterm labor is labor that starts before you reach 37 weeks of pregnancy.  Delivering your baby early increases your baby's risk of developing lifelong problems.  The exact cause of preterm labor is unknown. However, having an abnormal uterus, an STI (sexually transmitted infection), or vaginal bleeding during pregnancy increases your risk for preterm labor.  Keep all follow-up visits as told by your health care provider. This is important.  Contact a health care provider if you have signs or symptoms of preterm labor. This information is not intended to replace advice given to you by your health care provider. Make sure you discuss any questions you have with your health care provider. Document Revised: 09/03/2019 Document Reviewed: 09/03/2019 Elsevier Patient Education  2021 [employer].

## 2021-01-07 NOTE — MAU Provider Note (Signed)
History     CSN: [id number]  Arrival date and time: 01/07/21 2020   Event Date/Time   First Provider Initiated Contact with Patient 01/07/21 2124      Chief Complaint  Patient presents with  . Abdominal Pain   [name] is a 25 y.o. G2P1001 at 51w3dwho presents to MAU for 7 contractions today. Patient reports she was here yesterday for contractions, and then returned again today because she had 7 contractions that "did not stop" and was told to return to MAU if the contractions did not stop. Patient reports also having constipation, reporting she passes small amounts of hard stool each time she tries to use the bathroom and has only had 2 "good" bowel movements since becoming pregnant. Patient also reports drinking a case of soda per week and about 5-6 12oz water bottles daily. Patient denies contractions on arrival to MAU and during her stay. Patient requests cervical exam to be sure further dilation has not occurred. Patient is worried because this did not happen in her last pregnancy.  Pt denies VB, LOF, ctx, vaginal discharge/odor/itching. Pt denies N/V, abdominal pain, constipation, diarrhea, or urinary problems. Pt denies fever, chills, fatigue, sweating or changes in appetite. Pt denies SOB or chest pain. Pt denies dizziness, HA, light-headedness, weakness.   OB History    Gravida  2   Para  1   Term  1   Preterm      AB      Living  1     SAB      IAB      Ectopic      Multiple  0   Live Births  1           Past Medical History:  Diagnosis Date  . ADHD (attention deficit hyperactivity disorder)   . Bipolar 1 disorder [place]   . Depression   . Mental disorder     Past Surgical History:  Procedure Laterality Date  . 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 Mental illness Mother   . Depression Mother   . Bipolar disorder Mother   . Diabetes Mother   . ADD / ADHD Father      Social History   Tobacco Use  . Smoking status: Former Smoker    Types: Cigarettes  . Smokeless tobacco: Never Used  . Tobacco comment: stopped with pregnancy; around others who smoke  Vaping Use  . Vaping Use: Former  . Quit date: 02/22/2020  Substance Use Topics  . Alcohol use: Not Currently    Comment: occasionally; stopped when found out pregnant- drank alot on New Years   . Drug use: No    Allergies:  Allergies  Allergen Reactions  . Latex Itching and Other (See Comments)    Reaction:  Burning   . Fish Allergy Rash    No medications prior to admission.    Review of Systems  Constitutional: Negative for chills, diaphoresis, fatigue and fever.  Eyes: Negative for visual disturbance.  Respiratory: Negative for shortness of breath.   Cardiovascular: Negative for chest pain.  Gastrointestinal: Negative for abdominal pain, constipation, diarrhea, nausea and vomiting.  Genitourinary: Negative for dysuria, flank pain, frequency, pelvic pain, urgency, vaginal bleeding and vaginal discharge.  Neurological: Negative for dizziness, weakness, light-headedness and headaches.   Physical Exam   Blood pressure (!) 107/53, pulse (!) 101, temperature 98 F (36.7 C),  temperature source Oral, resp. rate 20, height '5\' 3"'  (1.6 m), weight 66.4 kg, last menstrual period 05/24/2020.  Patient Vitals for the past 24 hrs:  BP Temp Temp src Pulse Resp Height Weight  01/07/21 2034 (!) 107/53 98 F (36.7 C) Oral (!) 101 20 '5\' 3"'  (1.6 m) 66.4 kg   Physical Exam Vitals and nursing note reviewed. Exam conducted with a chaperone present.  Constitutional:      General: She is not in acute distress.    Appearance: Normal appearance. She is not ill-appearing, toxic-appearing or diaphoretic.  HENT:     Head: Normocephalic and atraumatic.  Pulmonary:     Effort: Pulmonary effort is normal.  Abdominal:     Palpations: Abdomen is soft.  Genitourinary:    General: Normal vulva.     Labia:         Right: No rash, tenderness or lesion.        Left: No rash, tenderness or lesion.      Comments: CE: posterior/FT/thick, unchanged from last visit Skin:    General: Skin is warm and dry.  Neurological:     Mental Status: She is alert and oriented to person, place, and time.  Psychiatric:        Mood and Affect: Mood normal.        Behavior: Behavior normal.        Thought Content: Thought content normal.        Judgment: Judgment normal.    No results found for this or any previous visit (from the past 24 hour(s)).  No results found.  MAU Course  Procedures  MDM -7ctx earlier today without ctx now -cervix unchanged from yesterday -urine sent for culture, swabs completed yesterday and normal -pt with constipation EFM: reactive       -baseline: 145       -variability: moderate       -accels: present, 10x10       -decels: absent       -TOCO: irritability -pt discharged to home in stable condition  Orders Placed This Encounter  Procedures  . Culture, OB Urine    Standing Status:   Standing    Number of Occurrences:   1  . Discharge patient    Order Specific Question:   Discharge disposition    Answer:   01-Home or Self Care [1]    Order Specific Question:   Discharge patient date    Answer:   01/07/2021   No orders of the defined types were placed in this encounter.  Assessment and Plan   1. Braxton Hicks contractions   2. Encounter for supervision of low-risk pregnancy in second trimester   3. [redacted] weeks gestation of pregnancy   4. NST (non-stress test) reactive   5. Constipation during pregnancy in second trimester     Allergies as of 01/07/2021      Reactions   Latex Itching, Other (See Comments)   Reaction:  Burning    Fish Allergy Rash      Medication List    TAKE these medications   bisacodyl 5 MG EC tablet Commonly known as: DULCOLAX Take 5 mg by mouth daily as needed for moderate constipation.   Blood Pressure Kit Devi 1 Device by Does not  apply route as needed.   docusate sodium 100 MG capsule Commonly known as: Colace Take 1 capsule (100 mg total) by mouth 2 (two) times daily.   pantoprazole 40 MG tablet Commonly known as: Protonix  Take 1 tablet (40 mg total) by mouth daily.   PRENATAL VITAMINS PO Take 1 tablet by mouth daily.   promethazine 25 MG tablet Commonly known as: PHENERGAN Take 1 tablet (25 mg total) by mouth every 6 (six) hours as needed for nausea or vomiting.      -will call with culture results, if positive -discussed link between abdominal pain and constipation, safe meds in pregnancy and miralax clean-out given -discussed appropriate hydration in pregnancy -discussed s/sx of PTL -return MAU precautions given -pt discharged to home in stable condition  Elmyra Ricks E Guy Toney 01/07/2021, 10:20 PM

## 2021-01-09 LAB — CULTURE, OB URINE

## 2021-01-12 NOTE — BH Specialist Note (Deleted)
Integrated Behavioral Health via Telemedicine Visit  01/12/2021 Allison Shaw [id number]  Number of Integrated Behavioral Health visits: *** Session Start time: 1:15***  Session End time: 1:45*** Total time: {IBH Total Time:21014050}  Referring Provider: *** Patient/Family location: Home*** [facility] Provider location: [facility]  All persons participating in visit: Patient *** and [facility] Allison Shaw ***  Types of Service: {CHL AMB TYPE OF SERVICE:[phone]}  I connected with Allison Shaw and/or Allison Shaw's {family members:20773} via  Telephone or Video Enabled Telemedicine Application  (Video is Caregility application) and verified that I am speaking with the correct person using two identifiers. Discussed confidentiality: {YES/NO:21197}  I discussed the limitations of telemedicine and the availability of in person appointments.  Discussed there is a possibility of technology failure and discussed alternative modes of communication if that failure occurs.  I discussed that engaging in this telemedicine visit, they consent to the provision of behavioral healthcare and the services will be billed under their insurance.  Patient and/or legal guardian expressed understanding and consented to Telemedicine visit: {YES/NO:21197}  Presenting Concerns: Patient and/or family reports the following symptoms/concerns: *** Duration of problem: ***; Severity of problem: {Mild/Moderate/Severe:20260}  Patient and/or Family's Strengths/Protective Factors: {CHL AMB BH PROTECTIVE FACTORS:[phone]}  Goals Addressed: Patient will: 1.  Reduce symptoms of: {IBH Symptoms:21014056}  2.  Increase knowledge and/or ability of: {IBH Patient Tools:21014057}  3.  Demonstrate ability to: {IBH Goals:21014053}  Progress towards Goals: {CHL AMB BH PROGRESS TOWARDS GOALS:[phone]}  Interventions: Interventions utilized:  {IBH  Interventions:21014054} Standardized Assessments completed: {IBH Screening Tools:21014051}  Patient and/or Family Response: ***  Assessment: Patient currently experiencing ***.   Patient may benefit from ***.  Plan: 1. Follow up with behavioral health clinician on : *** 2. Behavioral recommendations: *** 3. Referral(s): {IBH Referrals:21014055}  I discussed the assessment and treatment plan with the patient and/or parent/guardian. They were provided an opportunity to ask questions and all were answered. They agreed with the plan and demonstrated an understanding of the instructions.   They were advised to call back or seek an in-person evaluation if the symptoms worsen or if the condition fails to improve as anticipated.  Valetta Close Kyia Rhude, LCSW

## 2021-01-27 ENCOUNTER — Encounter: Payer: Self-pay | Admitting: Medical

## 2021-01-29 ENCOUNTER — Ambulatory Visit (INDEPENDENT_AMBULATORY_CARE_PROVIDER_SITE_OTHER): Payer: Medicaid Other | Admitting: Medical

## 2021-01-29 ENCOUNTER — Other Ambulatory Visit: Payer: Self-pay

## 2021-01-29 ENCOUNTER — Other Ambulatory Visit: Payer: Medicaid Other

## 2021-01-29 ENCOUNTER — Encounter: Payer: Self-pay | Admitting: Medical

## 2021-01-29 VITALS — BP 114/78 | HR 114 | Wt 150.7 lb

## 2021-01-29 DIAGNOSIS — Z3A27 27 weeks gestation of pregnancy: Secondary | ICD-10-CM

## 2021-01-29 DIAGNOSIS — Z23 Encounter for immunization: Secondary | ICD-10-CM | POA: Diagnosis not present

## 2021-01-29 DIAGNOSIS — Z3490 Encounter for supervision of normal pregnancy, unspecified, unspecified trimester: Secondary | ICD-10-CM

## 2021-01-29 DIAGNOSIS — L299 Pruritus, unspecified: Secondary | ICD-10-CM

## 2021-01-29 DIAGNOSIS — O1202 Gestational edema, second trimester: Secondary | ICD-10-CM

## 2021-01-29 DIAGNOSIS — Z3493 Encounter for supervision of normal pregnancy, unspecified, third trimester: Secondary | ICD-10-CM

## 2021-01-29 DIAGNOSIS — O479 False labor, unspecified: Secondary | ICD-10-CM

## 2021-01-29 DIAGNOSIS — Z2839 Other underimmunization status: Secondary | ICD-10-CM

## 2021-01-29 DIAGNOSIS — Z3A28 28 weeks gestation of pregnancy: Secondary | ICD-10-CM

## 2021-01-29 NOTE — Progress Notes (Signed)
Patient complains of pain/pressure on vagina along with swelling, denies any vaginal bleeding. Patient also complains of legs "wanting to give out" when she walks along excessive itching in different places of body

## 2021-01-29 NOTE — Progress Notes (Signed)
   PRENATAL VISIT NOTE  Subjective:  Allison Shaw is a 25 y.o. G2P1001 at 52w4dbeing seen today for ongoing prenatal care.  She is currently monitored for the following issues for this low-risk pregnancy and [place] 2 [place]; Attention deficit hyperactivity disorder (ADHD); Supervision of low-risk pregnancy; History of vacuum extraction assisted delivery; Rubella non-immune status, antepartum; and Hx of fetal anomaly in prior pregnancy, currently pregnant on their problem list.  Patient reports occasional contractions and swelling in vaginal area, itching all over (mostly abdominal).  Contractions: Irritability. Vag. Bleeding: None.  Movement: Present. Denies leaking of fluid.   The following portions of the patient's history were reviewed and updated as appropriate: allergies, current medications, past family history, past medical history, past social history, past surgical history and problem list.   Objective:   Vitals:   01/29/21 0912  BP: 114/78  Pulse: (!) 114  Weight: 150 lb 11.2 oz (68.4 kg)    Fetal Status: Fetal Heart Rate (bpm): 145 Fundal Height: 30 cm Movement: Present     General:  Alert, oriented and cooperative. Patient is in no acute distress.  Skin: Skin is warm and dry. No rash noted.   Cardiovascular: Normal heart rate noted  Respiratory: Normal respiratory effort, no problems with respiration noted  Abdomen: Soft, gravid, appropriate for gestational age.  Pain/Pressure: Present     Pelvic: Cervical exam deferred        Extremities: Normal range of motion.  Edema: Trace  Mental Status: Normal mood and affect. Normal behavior. Normal judgment and thought content.   Assessment and Plan:  Pregnancy: G2P1001 at 238w4d. Encounter for supervision of low-risk pregnancy, antepartum - 2 hour GTT, CBC, HIV, RPR today  - Tdap vaccine greater than or equal to 7yo IM - FH measuring ahead, patient states last child was > 8 lbs, discussed option to have growth  US[place]at 36 weeks and possible IOL 39-40 weeks depending on EFW  2. Rubella non-immune status, antepartum - PP MMR planned  3. Itching - Diffuse, but mostly on abdomen, some on arms and back, no rash - Bile acids, total - Comp Met (CMET)  4. Gestational edema in second trimester - Increase PO intake when possible and monitor  5. Braxton Hick's contraction - Had MAU visit 3 weeks ago, cervix 1 cm dilated  - Continues to have daily contractions with irregular pattern  - PTL signs discussed and when to go to MAU  7. [redacted] weeks gestation of pregnancy  Preterm labor symptoms and general obstetric precautions including but not limited to vaginal bleeding, contractions, leaking of fluid and fetal movement were reviewed in detail with the patient. Please refer to After Visit Summary for other counseling recommendations.   Return in about 2 weeks (around 02/12/2021) for LOB, Virtual.  No future appointments.  JuKerry HoughPA-C

## 2021-01-30 LAB — CBC
Hematocrit: 32.9 % — ABNORMAL LOW (ref 34.0–46.6)
Hemoglobin: 10.7 g/dL — ABNORMAL LOW (ref 11.1–15.9)
MCH: 29.2 pg (ref 26.6–33.0)
MCHC: 32.5 g/dL (ref 31.5–35.7)
MCV: 90 fL (ref 79–97)
Platelets: 173 10*3/uL (ref 150–450)
RBC: 3.67 x10E6/uL — ABNORMAL LOW (ref 3.77–5.28)
RDW: 12.9 % (ref 11.7–15.4)
WBC: 8.3 10*3/uL (ref 3.4–10.8)

## 2021-01-30 LAB — HIV ANTIBODY (ROUTINE TESTING W REFLEX): HIV Screen 4th Generation wRfx: NONREACTIVE

## 2021-01-30 LAB — GLUCOSE TOLERANCE, 2 HOURS W/ 1HR
Glucose, 1 hour: 170 mg/dL (ref 65–179)
Glucose, 2 hour: 96 mg/dL (ref 65–152)
Glucose, Fasting: 85 mg/dL (ref 65–91)

## 2021-01-30 LAB — RPR: RPR Ser Ql: NONREACTIVE

## 2021-01-31 LAB — COMPREHENSIVE METABOLIC PANEL
ALT: 8 IU/L (ref 0–32)
AST: 10 IU/L (ref 0–40)
Albumin/Globulin Ratio: 1.5 (ref 1.2–2.2)
Albumin: 3.4 g/dL — ABNORMAL LOW (ref 3.9–5.0)
Alkaline Phosphatase: 62 IU/L (ref 44–121)
BUN/Creatinine Ratio: 12 (ref 9–23)
BUN: 6 mg/dL (ref 6–20)
Bilirubin Total: <0.2 mg/dL (ref 0.0–1.2)
CO2: 15 mmol/L — ABNORMAL LOW (ref 20–29)
Calcium: 8.6 mg/dL — ABNORMAL LOW (ref 8.7–10.2)
Chloride: 105 mmol/L (ref 96–106)
Creatinine, Ser: 0.49 mg/dL — ABNORMAL LOW (ref 0.57–1.00)
Globulin, Total: 2.3 g/dL (ref 1.5–4.5)
Glucose: 178 mg/dL — ABNORMAL HIGH (ref 65–99)
Potassium: 3.6 mmol/L (ref 3.5–5.2)
Sodium: 137 mmol/L (ref 134–144)
Total Protein: 5.7 g/dL — ABNORMAL LOW (ref 6.0–8.5)
eGFR: 135 mL/min/{1.73_m2} (ref >59)

## 2021-01-31 LAB — BILE ACIDS, TOTAL: Bile Acids Total: 3.3 umol/L (ref 0.0–10.0)

## 2021-02-10 ENCOUNTER — Encounter (HOSPITAL_COMMUNITY): Payer: Self-pay | Admitting: Obstetrics and Gynecology

## 2021-02-10 ENCOUNTER — Inpatient Hospital Stay (HOSPITAL_COMMUNITY)
Admission: AD | Admit: 2021-02-10 | Discharge: 2021-02-10 | Disposition: A | Discharge status: Home / Self Care | Payer: Medicaid Other | Attending: Obstetrics and Gynecology | Admitting: Obstetrics and Gynecology

## 2021-02-10 ENCOUNTER — Other Ambulatory Visit: Payer: Self-pay

## 2021-02-10 DIAGNOSIS — O2343 Unspecified infection of urinary tract in pregnancy, third trimester: Secondary | ICD-10-CM | POA: Diagnosis not present

## 2021-02-10 DIAGNOSIS — Z91040 Latex allergy status: Secondary | ICD-10-CM | POA: Insufficient documentation

## 2021-02-10 DIAGNOSIS — Z3A29 29 weeks gestation of pregnancy: Secondary | ICD-10-CM

## 2021-02-10 DIAGNOSIS — Z79899 Other long term (current) drug therapy: Secondary | ICD-10-CM | POA: Diagnosis not present

## 2021-02-10 DIAGNOSIS — O26893 Other specified pregnancy related conditions, third trimester: Secondary | ICD-10-CM | POA: Diagnosis not present

## 2021-02-10 DIAGNOSIS — O4703 False labor before 37 completed weeks of gestation, third trimester: Secondary | ICD-10-CM | POA: Diagnosis not present

## 2021-02-10 DIAGNOSIS — Z3493 Encounter for supervision of normal pregnancy, unspecified, third trimester: Secondary | ICD-10-CM

## 2021-02-10 LAB — URINALYSIS, ROUTINE W REFLEX MICROSCOPIC
Bilirubin Urine: NEGATIVE
Glucose, UA: NEGATIVE mg/dL
Hgb urine dipstick: NEGATIVE
Ketones, ur: NEGATIVE mg/dL
Nitrite: NEGATIVE
Protein, ur: NEGATIVE mg/dL
Specific Gravity, Urine: 1.011 (ref 1.005–1.030)
pH: 6 (ref 5.0–8.0)

## 2021-02-10 LAB — WET PREP, GENITAL
Clue Cells Wet Prep HPF POC: NONE SEEN
Sperm: NONE SEEN
Trich, Wet Prep: NONE SEEN
Yeast Wet Prep HPF POC: NONE SEEN

## 2021-02-10 LAB — FETAL FIBRONECTIN: Fetal Fibronectin: NEGATIVE

## 2021-02-10 MED ORDER — CEFADROXIL 500 MG PO CAPS: 500.0000 mg | ORAL_CAPSULE | Freq: Two times a day (BID) | ORAL | 0 refills | Status: AC

## 2021-02-10 MED ORDER — TRAMADOL HCL 50 MG PO TABS: 50.0000 mg | ORAL_TABLET | Freq: Four times a day (QID) | ORAL | 0 refills | Status: DC | PRN

## 2021-02-10 MED ORDER — CEFADROXIL 500 MG PO CAPS: 500.0000 mg | ORAL_CAPSULE | Freq: Two times a day (BID) | ORAL | 0 refills | Status: DC

## 2021-02-10 NOTE — MAU Provider Note (Signed)
Chief Complaint:  Contractions   Event Date/Time   First Provider Initiated Contact with Patient 02/10/21 1806      HPI: Allison Shaw is a 25 y.o. G2P1001 at 39w2dwho presents to maternity admissions reporting onset of painful cramping today that is not resolved with rest or PO fluids.  There is vaginal and rectal pressure with the cramping.   She reports good fetal movement, denies LOF, vaginal bleeding, vaginal itching/burning, urinary symptoms, h/a, dizziness, n/v, or fever/chills.     Location: lower abdomen Quality: cramping Severity: 6/10 on pain scale Duration: 1 day Timing: intermittent Modifying factors: not improved with fluids or rest Associated signs and symptoms: pelvic pressure  HPI  Past Medical History: Past Medical History:  Diagnosis Date   ADHD (attention deficit hyperactivity disorder)    Bipolar 1 disorder ([place]    Depression    Mental disorder     Past obstetric history: OB History  Gravida Para Term Preterm AB Living  '2 1 1     1  ' SAB IAB Ectopic Multiple Live Births        0 1    # Outcome Date GA Lbr Len/2nd Weight Sex Delivery Anes PTL Lv  2 Current           1 Term 05/19/16 425w2d 02:15 3657 g F Vag-Vacuum EPI  LIV     Birth Comments: Induced for Postdates with cytotec, foley bulb. 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    Family History: Family History  Problem Relation Age of Onset   Mental illness Mother    Depression Mother    Bipolar disorder Mother    Diabetes Mother    ADD / ADHD Father     Social History: Social History   Tobacco Use   Smoking status: Former    Pack years: 0.00    Types: Cigarettes   Smokeless tobacco: Never   Tobacco comments:    stopped with pregnancy; around others who smoke  Vaping Use   Vaping Use: Former   Quit date: 02/22/2020  Substance Use Topics    Alcohol use: Not Currently    Comment: occasionally; stopped when found out pregnant- drank alot on New Years    Drug use: No    Allergies:  Allergies  Allergen Reactions   Latex Itching and Other (See Comments)    Reaction:  Burning    Fish Allergy Rash    Meds:  No medications prior to admission.    ROS:  Review of Systems  Constitutional:  Negative for chills, fatigue and fever.  Eyes:  Negative for visual disturbance.  Respiratory:  Negative for shortness of breath.   Cardiovascular:  Negative for chest pain.  Gastrointestinal:  Positive for abdominal pain. Negative for nausea and vomiting.  Genitourinary:  Positive for pelvic pain. Negative for difficulty urinating, dysuria, flank pain, vaginal bleeding, vaginal discharge and vaginal pain.  Neurological:  Negative for dizziness and headaches.  Psychiatric/Behavioral: Negative.      I have reviewed patient's Past Medical Hx, Surgical Hx, Family Hx, Social Hx, medications and allergies.   Physical Exam  Patient Vitals for the past 24 hrs:  BP Temp Temp src Pulse Resp SpO2 Height Weight  02/10/21 2002 112/65 97.7 F (36.5 C) Oral 100 16 100 % -- --  02/10/21 1747 117/65 -- -- (!) 105 -- -- -- --  02/10/21 1733 (!) 105/53 98 F (36.7 C) Oral (!) 117 18 100 % -- --  02/10/21 1729 -- -- -- -- -- -- '5\' 3"'  (1.6 m) 69.5 kg   Constitutional: Well-developed, well-nourished female in no acute distress.  Cardiovascular: normal rate Respiratory: normal effort GI: Abd soft, non-tender, gravid appropriate for gestational age.  MS: Extremities nontender, no edema, normal ROM Neurologic: Alert and oriented x 4.  GU: Neg CVAT.  PELVIC EXAM: Cervix pink, visually closed, without lesion, scant white creamy discharge, vaginal walls and external genitalia normal Bimanual exam: Cervix 0/long/high, firm, anterior, neg CMT, uterus nontender, nonenlarged, adnexa without tenderness, enlargement, or mass  Dilation: Closed Effacement  (%): Thick Exam by:: leftwich-kirby cnm  FHT:  Baseline 135 , moderate variability, accelerations present, no decelerations Contractions: q 10-15 minutes with more frequent irritability   Labs: Results for orders placed or performed during the hospital encounter of 02/10/21 (from the past 24 hour(s))  Fetal fibronectin     Status: None   Collection Time: 02/10/21  6:25 PM  Result Value Ref Range   Fetal Fibronectin NEGATIVE NEGATIVE  Wet prep, genital     Status: Abnormal   Collection Time: 02/10/21  6:25 PM   Specimen: Genital  Result Value Ref Range   Yeast Wet Prep HPF POC NONE SEEN NONE SEEN   Trich, Wet Prep NONE SEEN NONE SEEN   Clue Cells Wet Prep HPF POC NONE SEEN NONE SEEN   WBC, Wet Prep HPF POC MANY (A) NONE SEEN   Sperm NONE SEEN   Urinalysis, Routine w reflex microscopic Urine, Clean Catch     Status: Abnormal   Collection Time: 02/10/21  6:35 PM  Result Value Ref Range   Color, Urine YELLOW YELLOW   APPearance HAZY (A) CLEAR   Specific Gravity, Urine 1.011 1.005 - 1.030   pH 6.0 5.0 - 8.0   Glucose, UA NEGATIVE NEGATIVE mg/dL   Hgb urine dipstick NEGATIVE NEGATIVE   Bilirubin Urine NEGATIVE NEGATIVE   Ketones, ur NEGATIVE NEGATIVE mg/dL   Protein, ur NEGATIVE NEGATIVE mg/dL   Nitrite NEGATIVE NEGATIVE   Leukocytes,Ua LARGE (A) NEGATIVE   RBC / HPF 0-5 0 - 5 RBC/hpf   WBC, UA 6-10 0 - 5 WBC/hpf   Bacteria, UA FEW (A) NONE SEEN   Squamous Epithelial / LPF 6-10 0 - 5   Mucus PRESENT    A/Positive/-- (02/22 1129)  Imaging:  No results found.  MAU Course/MDM: Orders Placed This Encounter  Procedures   Wet prep, genital   Urinalysis, Routine w reflex microscopic Urine, Clean Catch   Fetal fibronectin   Discharge patient    Meds ordered this encounter  Medications   DISCONTD: cefadroxil (DURICEF) 500 MG capsule    Sig: Take 1 capsule (500 mg total) by mouth 2 (two) times daily for 7 days.    Dispense:  14 capsule    Refill:  0    Order Specific  Question:   Supervising Provider    Answer:   Janyth Pupa [[id number]]   DISCONTD: traMADol (ULTRAM) 50 MG tablet    Sig: Take 1-2 tablets (50-100 mg total) by mouth every 6 (six) hours as needed.    Dispense:  6 tablet    Refill:  0    Order Specific Question:   Supervising Provider    Answer:   Janyth Pupa [[id number]]   traMADol (ULTRAM) 50 MG tablet    Sig: Take 1-2 tablets (50-100 mg total) by mouth every 6 (  six) hours as needed.    Dispense:  6 tablet    Refill:  0    Order Specific Question:   Supervising Provider    Answer:   Janyth Pupa [[id number]]   cefadroxil (DURICEF) 500 MG capsule    Sig: Take 1 capsule (500 mg total) by mouth 2 (two) times daily for 7 days.    Dispense:  14 capsule    Refill:  0    Order Specific Question:   Supervising Provider    Answer:   Janyth Pupa [[id number]]     NST reviewed and appropriate for gestational age FFN negative and cervix unchanged in MAU, no evidence of preterm labor UA with large leukocytes so will treat for UTI with Duricef BID and Tramadol x 6 tabs only for pain Precautions/reasons to return to MAU given   Assessment: 1. Encounter for supervision of low-risk pregnancy in third trimester   2. Threatened preterm labor, third trimester   3. UTI (urinary tract infection) during pregnancy, third trimester   4. [redacted] weeks gestation of pregnancy     Plan: Discharge home Labor precautions and fetal kick counts  Follow-up [place] for [place] at [facility] Follow up.   Specialty: Obstetrics and Gynecology Why: As scheduled Contact information: [address] [place] [id number] [place] [facility] Follow up.   Specialty: Obstetrics and Gynecology Why: As needed for emergencies Contact information: [address] [id number] [place] [phone]               Allergies as of  02/10/2021       Reactions   Latex Itching, Other (See Comments)   Reaction:  Burning    Fish Allergy Rash        Medication List     TAKE these medications    bisacodyl 5 MG EC tablet Commonly known as: DULCOLAX Take 5 mg by mouth daily as needed for moderate constipation.   Blood Pressure Kit Devi 1 Device by Does not apply route as needed.   cefadroxil 500 MG capsule Commonly known as: DURICEF Take 1 capsule (500 mg total) by mouth 2 (two) times daily for 7 days.   docusate sodium 100 MG capsule Commonly known as: Colace Take 1 capsule (100 mg total) by mouth 2 (two) times daily.   pantoprazole 40 MG tablet Commonly known as: Protonix Take 1 tablet (40 mg total) by mouth daily.   PRENATAL VITAMINS PO Take 1 tablet by mouth daily.   promethazine 25 MG tablet Commonly known as: PHENERGAN Take 1 tablet (25 mg total) by mouth every 6 (six) hours as needed for nausea or vomiting.   traMADol 50 MG tablet Commonly known as: ULTRAM Take 1-2 tablets (50-100 mg total) by mouth every 6 (six) hours as needed.        Fatima Blank Certified Nurse-Midwife 02/10/2021 10:54 PM

## 2021-02-10 NOTE — MAU Note (Signed)
Presents with c/o ctxs that are 5 minutes apart that began @ 1530 this afternoon.  Reports both rectal and vaginal pressure.  Denies VB or LOF.  Endorses +FM.

## 2021-02-11 LAB — GC/CHLAMYDIA PROBE AMP (~~LOC~~) NOT AT ARMC
Chlamydia: NEGATIVE
Comment: NEGATIVE
Comment: NORMAL
Neisseria Gonorrhea: NEGATIVE

## 2021-02-12 ENCOUNTER — Encounter: Payer: Self-pay | Admitting: Medical

## 2021-02-12 ENCOUNTER — Telehealth (INDEPENDENT_AMBULATORY_CARE_PROVIDER_SITE_OTHER): Payer: Medicaid Other | Admitting: Medical

## 2021-02-12 DIAGNOSIS — O09293 Supervision of pregnancy with other poor reproductive or obstetric history, third trimester: Secondary | ICD-10-CM

## 2021-02-12 DIAGNOSIS — F3181 Bipolar II disorder: Secondary | ICD-10-CM

## 2021-02-12 DIAGNOSIS — Z3493 Encounter for supervision of normal pregnancy, unspecified, third trimester: Secondary | ICD-10-CM

## 2021-02-12 DIAGNOSIS — Z3A29 29 weeks gestation of pregnancy: Secondary | ICD-10-CM

## 2021-02-12 DIAGNOSIS — Z2839 Other underimmunization status: Secondary | ICD-10-CM

## 2021-02-12 DIAGNOSIS — O99343 Other mental disorders complicating pregnancy, third trimester: Secondary | ICD-10-CM

## 2021-02-12 DIAGNOSIS — Z8759 Personal history of other complications of pregnancy, childbirth and the puerperium: Secondary | ICD-10-CM

## 2021-02-12 NOTE — Progress Notes (Signed)
I connected with Allison Shaw 02/12/21 at 10:40 AM EDT by: MyChart video and verified that I am speaking with the correct person using two identifiers.  Patient is located at home and provider is located at [facility].     The purpose of this virtual visit is to provide medical care while limiting exposure to the novel coronavirus. I discussed the limitations, risks, security and privacy concerns of performing an evaluation and management service by MyChart video and the availability of in person appointments. I also discussed with the patient that there may be a patient responsible charge related to this service. By engaging in this virtual visit, you consent to the provision of healthcare.  Additionally, you authorize for your insurance to be billed for the services provided during this visit.  The patient expressed understanding and agreed to proceed.  The following staff members participated in the virtual visit:  Carver Fila, CMA    PRENATAL VISIT NOTE  Subjective:  Allison Shaw is a 25 y.o. G2P1001 at [redacted]w[redacted]d for phone visit for ongoing prenatal care.  She is currently monitored for the following issues for this low-risk pregnancy and has Bipolar 2 disorder ([place]; Attention deficit hyperactivity disorder (ADHD); Supervision of low-risk pregnancy; History of vacuum extraction assisted delivery; Rubella non-immune status, antepartum; and Hx of fetal anomaly in prior pregnancy, currently pregnant on their problem list.  Patient reports occasional contractions and pain with fetal movement. Patient was seen in MAU on 6/29 for contractions. TOCO showed contractions q 10-15 minute and irritability. Cervix closed, thick. FFN negative. Patient states only few contractions now, but continued pain with FM. Taking Toradol PRN for pain and still on course of Duricef for UTI.  Contractions: Irritability. Vag. Bleeding: None.  Movement: Present. Denies leaking of fluid.   The following portions of the patient's  history were reviewed and updated as appropriate: allergies, current medications, past family history, past medical history, past social history, past surgical history and problem list.   Objective:  There were no vitals filed for this visit. Patient did not have BP cuff. Will send in MyChart when available.   Fetal Status:     Movement: Present     Assessment and Plan:  Pregnancy: G2P1001 at 285w4d. Encounter for supervision of low-risk pregnancy in third trimester - Normal third trimester labs discussed   2. Bipolar 2 disorder ([place]- Declines need for further IBHC follow-up today   3. Rubella non-immune status, antepartum - Plan for PP MMR   4. History of vacuum extraction assisted delivery - [place] FOLLOW [place]; scheduled at 36 weeks for growth   5. [redacted] weeks gestation of pregnancy  Preterm labor symptoms and general obstetric precautions including but not limited to vaginal bleeding, contractions, leaking of fluid and fetal movement were reviewed in detail with the patient.  Return in about 2 weeks (around 02/26/2021) for LOB, In-Person, any provider.  Future Appointments  Date Time Provider [place]8/04/2021  9:30 AM [facility]/2021  9:45 AM [facility] US5 [facility] [place]   Time spent on virtual visit: 15 minutes  JuKerry HoughPA-C

## 2021-02-12 NOTE — Progress Notes (Signed)
Pt will take BP later today & record in BRx, Pt also states is still having some pain but is still taking the Tramadol & Duricef that was Rx in MAU Wednesday.

## 2021-03-01 ENCOUNTER — Ambulatory Visit (INDEPENDENT_AMBULATORY_CARE_PROVIDER_SITE_OTHER): Payer: Medicaid Other | Admitting: Obstetrics and Gynecology

## 2021-03-01 ENCOUNTER — Other Ambulatory Visit: Payer: Self-pay

## 2021-03-01 ENCOUNTER — Encounter: Payer: Self-pay | Admitting: Obstetrics and Gynecology

## 2021-03-01 VITALS — BP 106/65 | HR 117 | Wt 156.3 lb

## 2021-03-01 DIAGNOSIS — Z3493 Encounter for supervision of normal pregnancy, unspecified, third trimester: Secondary | ICD-10-CM

## 2021-03-01 DIAGNOSIS — O09299 Supervision of pregnancy with other poor reproductive or obstetric history, unspecified trimester: Secondary | ICD-10-CM

## 2021-03-01 DIAGNOSIS — O09899 Supervision of other high risk pregnancies, unspecified trimester: Secondary | ICD-10-CM

## 2021-03-01 DIAGNOSIS — Z2839 Other underimmunization status: Secondary | ICD-10-CM

## 2021-03-01 DIAGNOSIS — Z3009 Encounter for other general counseling and advice on contraception: Secondary | ICD-10-CM | POA: Insufficient documentation

## 2021-03-01 NOTE — Patient Instructions (Signed)

## 2021-03-01 NOTE — Progress Notes (Signed)
Subjective:  Allison Shaw is a 25 y.o. G2P1001 at [redacted]w[redacted]d being seen today for ongoing prenatal care.  She is currently monitored for the following issues for this low-risk pregnancy and has Bipolar 2 disorder (HCC); Attention deficit hyperactivity disorder (ADHD); Supervision of low-risk pregnancy; History of vacuum extraction assisted delivery; Rubella non-immune status, antepartum; Hx of fetal anomaly in prior pregnancy, currently pregnant; and Unwanted fertility on their problem list.  Patient reports general discomforts of pregnancy.  Contractions: Irritability. Vag. Bleeding: None.  Movement: Present. Denies leaking of fluid.   The following portions of the patient's history were reviewed and updated as appropriate: allergies, current medications, past family history, past medical history, past social history, past surgical history and problem list. Problem list updated.  Objective:   Vitals:   03/01/21 0837  BP: 106/65  Pulse: (!) 117  Weight: 156 lb 4.8 oz (70.9 kg)    Fetal Status:     Movement: Present     General:  Alert, oriented and cooperative. Patient is in no acute distress.  Skin: Skin is warm and dry. No rash noted.   Cardiovascular: Normal heart rate noted  Respiratory: Normal respiratory effort, no problems with respiration noted  Abdomen: Soft, gravid, appropriate for gestational age. Pain/Pressure: Present     Pelvic:  Cervical exam deferred        Extremities: Normal range of motion.  Edema: Trace  Mental Status: Normal mood and affect. Normal behavior. Normal judgment and thought content.   Urinalysis:      Assessment and Plan:  Pregnancy: G2P1001 at [redacted]w[redacted]d  1. Encounter for supervision of low-risk pregnancy in third trimester Stable Growth scan 03/23/21  2. Hx of fetal anomaly in prior pregnancy, currently pregnant Normal fetal ECHO  3. Rubella non-immune status, antepartum Vaccine PP  4. Unwanted fertility BTL papers signed  Preterm labor symptoms  and general obstetric precautions including but not limited to vaginal bleeding, contractions, leaking of fluid and fetal movement were reviewed in detail with the patient. Please refer to After Visit Summary for other counseling recommendations.  Return in about 2 weeks (around 03/15/2021) for OB visit, face to face, any provider.   Hermina Staggers, MD

## 2021-03-09 ENCOUNTER — Ambulatory Visit: Payer: Self-pay | Admitting: *Deleted

## 2021-03-09 NOTE — Telephone Encounter (Signed)
Pt called the community line asking to speak to a nurse since she is pregnant and has been having some pain and is unable to defecate  Called patient to review sx. Patient is [redacted] weeks pregnant and c/o constipation. Taking miralax as ordered and had BM . C/o feeling pressure in vaginal and rectal area. Bilateral LEs swollen and painful. Decreased fluid intake today due to patient being sleepy today. Reports she has noted decreased fetal movement but does not states how decreased movement has been just less than normal. Reports baby with "hiccups" right now. EDD 04/26/21. Patient denies rectal bleeding or blood in stool . Stool hard and soft in consistency per patient. Encouraged patient to notify OBGYN in am and if fetal movement less than 5 times in an hour or less than 10 times in 2 hours go to LD now. Instructed patient to increase fluid intake . Care advise given. Patient verbalized understanding of care advise and to call back or to ED if symptoms worsen.     Reason for Disposition  [1] Uses laxative (e.g., PEG / Miralax. Milk of magnesia) or enema AND [2] > once a month  Answer Assessment - Initial Assessment Questions 1. STOOL PATTERN OR FREQUENCY: "How often do you have a bowel movement (BM)?"  (Normal range: 3 times a day to every 3 days)  "When was your last BM?"       Na  2. STRAINING: "Do you have to strain to have a BM?"      Yes  3. RECTAL PAIN: "Does your rectum hurt when the stool comes out?" If Yes, ask: "Do you have hemorrhoids? How bad is the pain?"  (Scale 1-10; or mild, moderate, severe)     Yes, no hemorrhoids  4. STOOL COMPOSITION: "Are the stools hard?"      Hard and soft  5. BLOOD ON STOOLS: "Has there been any blood on the toilet tissue or on the surface of the BM?" If Yes, ask: "When was the last time?"      Denies  6. CHRONIC CONSTIPATION: "Is this a new problem for you?"  If no, ask: How long have you had this problem?" (days, weeks, months)      No . Has had  constipation before .  7. CHANGES IN DIET OR HYDRATION: "Have there been any recent changes in your diet?" "How much fluids are you drinking on a daily basis?"  "How much have you had to drink today?"     Not drinking as much as usual today , very sleepy 8. MEDICATIONS: "Have you been taking any new medications?" "Are you taking any narcotic pain medications?" (e.g., Vicodin, Percocet, morphine, Dilaudid)     na 9. LAXATIVES: "Have you been using any stool softeners, laxatives, or enemas?"  If yes, ask "What, how often, and when was the last time?"     Miralax as oredered  10. ACTIVITY:  "How much walking do you do every day?" "Has your activity level decreased in the past week?"        Decreased activity today  11. OTHER SYMPTOMS: "Do you have any other symptoms?" (e.g., abdominal pain, fever, vaginal bleeding, decreased fetal movement)       Not as much movement from baby as normal. "Hiccups" now  12. EDD: "What date are you expecting to deliver?"       04/26/21  Protocols used: Pregnancy - Constipation-A-AH

## 2021-03-14 ENCOUNTER — Encounter (HOSPITAL_COMMUNITY): Payer: Self-pay | Admitting: Obstetrics and Gynecology

## 2021-03-14 ENCOUNTER — Inpatient Hospital Stay (HOSPITAL_COMMUNITY)
Admission: AD | Admit: 2021-03-14 | Discharge: 2021-03-14 | Disposition: A | Discharge status: Home / Self Care | Payer: Medicaid Other | Attending: Obstetrics and Gynecology | Admitting: Obstetrics and Gynecology

## 2021-03-14 ENCOUNTER — Other Ambulatory Visit: Payer: Self-pay

## 2021-03-14 DIAGNOSIS — N858 Other specified noninflammatory disorders of uterus: Secondary | ICD-10-CM

## 2021-03-14 DIAGNOSIS — R109 Unspecified abdominal pain: Secondary | ICD-10-CM | POA: Insufficient documentation

## 2021-03-14 DIAGNOSIS — Z3A33 33 weeks gestation of pregnancy: Secondary | ICD-10-CM | POA: Diagnosis not present

## 2021-03-14 DIAGNOSIS — Z79899 Other long term (current) drug therapy: Secondary | ICD-10-CM | POA: Insufficient documentation

## 2021-03-14 DIAGNOSIS — Z91040 Latex allergy status: Secondary | ICD-10-CM | POA: Diagnosis not present

## 2021-03-14 DIAGNOSIS — O26893 Other specified pregnancy related conditions, third trimester: Secondary | ICD-10-CM | POA: Insufficient documentation

## 2021-03-14 DIAGNOSIS — R519 Headache, unspecified: Secondary | ICD-10-CM | POA: Insufficient documentation

## 2021-03-14 DIAGNOSIS — Z87891 Personal history of nicotine dependence: Secondary | ICD-10-CM | POA: Insufficient documentation

## 2021-03-14 LAB — URINALYSIS, ROUTINE W REFLEX MICROSCOPIC
Bilirubin Urine: NEGATIVE
Glucose, UA: NEGATIVE mg/dL
Hgb urine dipstick: NEGATIVE
Ketones, ur: NEGATIVE mg/dL
Nitrite: NEGATIVE
Protein, ur: NEGATIVE mg/dL
Specific Gravity, Urine: 1.014 (ref 1.005–1.030)
pH: 6 (ref 5.0–8.0)

## 2021-03-14 LAB — WET PREP, GENITAL
Clue Cells Wet Prep HPF POC: NONE SEEN
Sperm: NONE SEEN
Trich, Wet Prep: NONE SEEN
Yeast Wet Prep HPF POC: NONE SEEN

## 2021-03-14 MED ORDER — CYCLOBENZAPRINE HCL 5 MG PO TABS
5.0000 mg | ORAL_TABLET | Freq: Once | ORAL | Status: AC
  Administered 2021-03-14: 5 mg via ORAL
  Filled 2021-03-14: qty 1

## 2021-03-14 MED ORDER — ACETAMINOPHEN 500 MG PO TABS
1000.0000 mg | ORAL_TABLET | Freq: Once | ORAL | Status: AC
  Administered 2021-03-14: 1000 mg via ORAL
  Filled 2021-03-14: qty 2

## 2021-03-14 MED ORDER — METOCLOPRAMIDE HCL 10 MG PO TABS
10.0000 mg | ORAL_TABLET | Freq: Once | ORAL | Status: AC
  Administered 2021-03-14: 10 mg via ORAL
  Filled 2021-03-14: qty 1

## 2021-03-14 NOTE — MAU Note (Signed)
Allison Shaw is a 25 y.o. at [redacted]w[redacted]d here in MAU reporting: has been feeling off today so she checked her BP at home and it was 126/62 and 118/73 and she feels like these are a little bit high for her. Has had a headache for the past few days- has taken tylenol and it did not relieve the pain. Also having numbness in her arms when she is laying on them. Having some cramping and some pressure. No bleeding or LOF. +FM  Onset of complaint: ongoing  Pain score: headache 3/10, cramping 4/10  Vitals:   03/14/21 1532  BP: 120/67  Pulse: (!) 102  Resp: 16  Temp: 98.4 F (36.9 C)  SpO2: 99%     FHT:132  Lab orders placed from triage: UA

## 2021-03-14 NOTE — MAU Provider Note (Signed)
History    [id number]  Arrival date and time: 03/14/21 1511   Chief Complaint  Patient presents with   Headache   Abdominal Pain   HPI Allison Shaw is a 25 y.o. at [place]by [place] presents to MAU with headache and abdominal cramping. Patient states that she has just felt unwell lately. She is tired and has been having abdominal cramping. It worsens when her baby is moving. It usually resolves on its own. She has had a headache for a couple of days that has not resolved with Tylenol. She thought she maybe had some spots in her vision but this is not present currently. No LE edema. She was worried about her blood pressure because it was 120/70, and it is usually lower than this. She denies dysuria but has had some vaginal discharge over the last week or so. She describes it as thin and watery. She denies vaginal bleeding. Additionally, she has had some numbness in her arms when she lays on them. Laying on her back sometimes causes numbness in her legs. She is not having these symptoms currently. She presented to MAU for further evaluation.  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    Family History  Problem Relation Age of Onset   Mental illness Mother    Depression Mother    Bipolar disorder Mother    Diabetes Mother    ADD / ADHD Father     Social History   Socioeconomic History   Marital status: Married    Spouse name: Not on file   Number of children: Not on file   Years of  education: Not on file   Highest education level: Not on file  Occupational History   Not on file  Tobacco Use   Smoking status: Former    Types: Cigarettes   Smokeless tobacco: Never   Tobacco comments:    stopped with pregnancy; around others who smoke  Vaping Use   Vaping Use: Former   Quit date: 02/22/2020  Substance and Sexual Activity   Alcohol use: Not Currently    Comment: occasionally; stopped when found out pregnant- drank alot on New Years    Drug use: No   Sexual activity: Yes    Comment: states missed a few days of the pill because not good pills ; stopped taking until end of October or early November  Other Topics Concern   Not on file  Social History Narrative   Not on file   Social Determinants of Health   Financial Resource Strain: Not on file  Food Insecurity: No Food Insecurity   Worried About RCharity fundraiserin the Last Year: Never true  Ran Out of Food in the Last Year: Never true  Transportation Needs: No Transportation Needs   Lack of Transportation (Medical): No   Lack of Transportation (Non-Medical): No  Physical Activity: Not on file  Stress: Not on file  Social Connections: Not on file  Intimate Partner Violence: Not At Risk   Fear of Current or Ex-Partner: No   Emotionally Abused: No   Physically Abused: No   Sexually Abused: No    Allergies  Allergen Reactions   Latex Itching and Other (See Comments)    Reaction:  Burning    Fish Allergy Rash    No current facility-administered medications on file prior to encounter.   Current Outpatient Medications on File Prior to Encounter  Medication Sig Dispense Refill   bisacodyl (DULCOLAX) 5 MG EC tablet Take 5 mg by mouth daily as needed for moderate constipation.     Blood Pressure Monitoring (BLOOD PRESSURE KIT) DEVI 1 Device by Does not apply route as needed. 1 each 0   docusate sodium (COLACE) 100 MG capsule Take 1 capsule (100 mg total) by mouth 2 (two) times daily. 30 capsule 0    pantoprazole (PROTONIX) 40 MG tablet Take 1 tablet (40 mg total) by mouth daily. 30 tablet 0   Prenatal Vit-Fe Fumarate-FA (PRENATAL VITAMINS PO) Take 1 tablet by mouth daily.     promethazine (PHENERGAN) 25 MG tablet Take 1 tablet (25 mg total) by mouth every 6 (six) hours as needed for nausea or vomiting. 30 tablet 0   traMADol (ULTRAM) 50 MG tablet Take 1-2 tablets (50-100 mg total) by mouth every 6 (six) hours as needed. 6 tablet 0    ROS Pertinent positives and negative per HPI, all others reviewed and negative  Physical Exam   BP 121/64 (BP Location: Right Arm)   Pulse 88   Temp 98.4 F (36.9 C) (Oral)   Resp 16   Ht '5\' 3"'  (1.6 m)   Wt 74.2 kg   LMP 05/24/2020   SpO2 99% Comment: room air  BMI 28.98 kg/m   Physical Exam Exam conducted with a chaperone present.  Constitutional:      General: She is not in acute distress.    Appearance: She is well-developed.  HENT:     Head: Normocephalic and atraumatic.     Mouth/Throat:     Mouth: Mucous membranes are moist.  Eyes:     Extraocular Movements: Extraocular movements intact.     Conjunctiva/sclera: Conjunctivae normal.     Pupils: Pupils are equal, round, and reactive to light.  Cardiovascular:     Rate and Rhythm: Normal rate and regular rhythm.     Heart sounds: No murmur heard. Pulmonary:     Effort: Pulmonary effort is normal.     Breath sounds: Normal breath sounds.  Abdominal:     General: Bowel sounds are normal.     Palpations: Abdomen is soft.     Tenderness: There is abdominal tenderness (mild tenderness over lower abdomen).  Genitourinary:    General: Normal vulva.     Cervix: No cervical motion tenderness.     Comments: Cervix closed Musculoskeletal:        General: Normal range of motion.     Cervical back: Normal range of motion.  Skin:    General: Skin is warm and dry.  Neurological:     General: No focal deficit present.     Mental Status: She is alert and oriented to person, place, and  time.  Motor: No weakness (5/5 strength in bilateral upper and lower extremities).  Psychiatric:        Mood and Affect: Mood normal.        Behavior: Behavior normal.   Cervical Exam Dilation: Closed Effacement (%): Thick Exam by:: Vilma Meckel CNM  FHT Baseline 130, moderate variability, + accels, no decels Toco: None Cat: 1  Labs Results for orders placed or performed during the hospital encounter of 03/14/21 (from the past 24 hour(s))  Wet prep, genital     Status: Abnormal   Collection Time: 03/14/21  2:27 PM   Specimen: Cervix  Result Value Ref Range   Yeast Wet Prep HPF POC NONE SEEN NONE SEEN   Trich, Wet Prep NONE SEEN NONE SEEN   Clue Cells Wet Prep HPF POC NONE SEEN NONE SEEN   WBC, Wet Prep HPF POC MODERATE (A) NONE SEEN   Sperm NONE SEEN   Urinalysis, Routine w reflex microscopic Urine, Clean Catch     Status: Abnormal   Collection Time: 03/14/21  4:19 PM  Result Value Ref Range   Color, Urine YELLOW YELLOW   APPearance HAZY (A) CLEAR   Specific Gravity, Urine 1.014 1.005 - 1.030   pH 6.0 5.0 - 8.0   Glucose, UA NEGATIVE NEGATIVE mg/dL   Hgb urine dipstick NEGATIVE NEGATIVE   Bilirubin Urine NEGATIVE NEGATIVE   Ketones, ur NEGATIVE NEGATIVE mg/dL   Protein, ur NEGATIVE NEGATIVE mg/dL   Nitrite NEGATIVE NEGATIVE   Leukocytes,Ua MODERATE (A) NEGATIVE   RBC / HPF 0-5 0 - 5 RBC/hpf   WBC, UA 6-10 0 - 5 WBC/hpf   Bacteria, UA RARE (A) NONE SEEN   Squamous Epithelial / LPF 11-20 0 - 5   Mucus PRESENT     Imaging No results found.  MAU Course Assessment and Plan  Procedures Lab Orders  Wet prep, genital  OB Urine Culture  Urinalysis, Routine w reflex microscopic Urine, Clean Catch  Meds ordered this encounter  Medications   acetaminophen (TYLENOL) tablet 1,000 mg   cyclobenzaprine (FLEXERIL) tablet 5 mg   metoCLOPramide (REGLAN) tablet 10 mg   Imaging Orders  No imaging studies ordered today   MDM Patient presents to MAU today with  complaint of intermittent lower abdominal pain and headache. HDS and normotensive upon arrival. Exam with mild lower abdominal tenderness to palpation. Normal neurological exam. No LE swelling. Cervix closed.   Wet prep, GC/CT, and UA obtained.   Wet prep negative, GC/CT pending, UA with moderate leukocytes and sent for OB culture.   Headache treated with Tylenol and Reglan. Flexeril given for lower abdominal discomfort.   Upon reassessment - symptoms resolved. Discussed abdominal symptoms likely due to uterine irritability and Braxton Hicks contractions. Tocometer quiet and cervix closed. No signs of preterm labor. Encouraged patient to try to adjust her positioning frequently to assist with comfort and prevent her arms/legs from falling asleep due to excessive pressure. BP continued to remain in normal range while in MAU - no concern for pre-eclampsia at this time.   Ultimately deemed stable for discharge with further outpatient management. Patient has follow up prenatal visit on Tuesday 8/2. Strict return precautions reviewed and patient voiced understanding.  Uterine irritability - Plan: Discharge patient  Pregnancy headache in third trimester - Plan: Discharge patient  Genia Del, MD Obstetrics Fellow 03/14/21 6:41 PM

## 2021-03-15 LAB — GC/CHLAMYDIA PROBE AMP (~~LOC~~) NOT AT ARMC
Chlamydia: NEGATIVE
Comment: NEGATIVE
Comment: NORMAL
Neisseria Gonorrhea: NEGATIVE

## 2021-03-16 ENCOUNTER — Ambulatory Visit (INDEPENDENT_AMBULATORY_CARE_PROVIDER_SITE_OTHER): Payer: Medicaid Other | Admitting: Nurse Practitioner

## 2021-03-16 ENCOUNTER — Other Ambulatory Visit: Payer: Self-pay

## 2021-03-16 VITALS — BP 111/71 | HR 93 | Wt 161.4 lb

## 2021-03-16 DIAGNOSIS — Z3A34 34 weeks gestation of pregnancy: Secondary | ICD-10-CM

## 2021-03-16 DIAGNOSIS — Z8759 Personal history of other complications of pregnancy, childbirth and the puerperium: Secondary | ICD-10-CM

## 2021-03-16 DIAGNOSIS — F3181 Bipolar II disorder: Secondary | ICD-10-CM

## 2021-03-16 DIAGNOSIS — Z3493 Encounter for supervision of normal pregnancy, unspecified, third trimester: Secondary | ICD-10-CM

## 2021-03-16 LAB — CULTURE, OB URINE

## 2021-03-16 NOTE — Progress Notes (Signed)
    Subjective:  Allison Shaw is a 25 y.o. G2P1001 at [redacted]w[redacted]d being seen today for ongoing prenatal care.  She is currently monitored for the following issues for this low-risk pregnancy and has Bipolar 2 disorder (HCC); Attention deficit hyperactivity disorder (ADHD); Supervision of low-risk pregnancy; History of vacuum extraction assisted delivery; Rubella non-immune status, antepartum; Hx of fetal anomaly in prior pregnancy, currently pregnant; and Unwanted fertility on their problem list.  Patient reports  periodic edema - none today .  Contractions: Irritability. Vag. Bleeding: None.  Movement: Present. Denies leaking of fluid.   The following portions of the patient's history were reviewed and updated as appropriate: allergies, current medications, past family history, past medical history, past social history, past surgical history and problem list. Problem list updated.  Objective:   Vitals:   03/16/21 0930  BP: 111/71  Pulse: 93  Weight: 161 lb 6.4 oz (73.2 kg)    Fetal Status: Fetal Heart Rate (bpm): 135 Fundal Height: 35 cm Movement: Present     General:  Alert, oriented and cooperative. Patient is in no acute distress.  Skin: Skin is warm and dry. No rash noted.   Cardiovascular: Normal heart rate noted  Respiratory: Normal respiratory effort, no problems with respiration noted  Abdomen: Soft, gravid, appropriate for gestational age. Pain/Pressure: Present     Pelvic:  Cervical exam deferred        Extremities: Normal range of motion.  Edema: Trace  Mental Status: Normal mood and affect. Normal behavior. Normal judgment and thought content.   Urinalysis:      Assessment and Plan:  Pregnancy: G2P1001 at [redacted]w[redacted]d  1. Encounter for supervision of low-risk pregnancy in third trimester Showed photo of swollen ankles - advised to limit hot dogs, canned foods and sodas - high in sodium to decrease edema - NO edema today.  Also discussed compression socks to wear if having  swelling.  2. History of vacuum extraction assisted delivery Has growth scan next week. Really wants to be induced by 40 weeks - went to [redacted]w[redacted]d weeks last pregnancy and vacuum was used.  Baby weighed 8 pounds, 1 ounce.  3. Bipolar 2 disorder (HCC)   4. [redacted] weeks gestation of pregnancy   Preterm labor symptoms and general obstetric precautions including but not limited to vaginal bleeding, contractions, leaking of fluid and fetal movement were reviewed in detail with the patient. Please refer to After Visit Summary for other counseling recommendations.  Return in about 2 weeks (around 03/30/2021) for in person ROB.  Nolene Bernheim, RN, MSN, NP-BC Nurse Practitioner, [facility] for [employer], [facility] 03/16/2021 9:58 AM

## 2021-03-16 NOTE — Progress Notes (Signed)
Pt states has been having a lot of swelling in ankles

## 2021-03-23 ENCOUNTER — Other Ambulatory Visit: Payer: Self-pay

## 2021-03-23 ENCOUNTER — Ambulatory Visit: Payer: Medicaid Other | Admitting: *Deleted

## 2021-03-23 ENCOUNTER — Encounter: Payer: Self-pay | Admitting: *Deleted

## 2021-03-23 ENCOUNTER — Ambulatory Visit: Payer: Medicaid Other | Attending: Medical

## 2021-03-23 VITALS — BP 122/56 | HR 97

## 2021-03-23 DIAGNOSIS — Z3493 Encounter for supervision of normal pregnancy, unspecified, third trimester: Secondary | ICD-10-CM

## 2021-03-23 DIAGNOSIS — O09293 Supervision of pregnancy with other poor reproductive or obstetric history, third trimester: Secondary | ICD-10-CM | POA: Diagnosis not present

## 2021-03-23 DIAGNOSIS — Z3A35 35 weeks gestation of pregnancy: Secondary | ICD-10-CM

## 2021-03-23 DIAGNOSIS — Z8759 Personal history of other complications of pregnancy, childbirth and the puerperium: Secondary | ICD-10-CM | POA: Diagnosis not present

## 2021-03-23 DIAGNOSIS — Z362 Encounter for other antenatal screening follow-up: Secondary | ICD-10-CM | POA: Diagnosis not present

## 2021-03-25 ENCOUNTER — Encounter (HOSPITAL_COMMUNITY): Payer: Self-pay | Admitting: Obstetrics & Gynecology

## 2021-03-25 ENCOUNTER — Other Ambulatory Visit: Payer: Self-pay

## 2021-03-25 ENCOUNTER — Inpatient Hospital Stay (HOSPITAL_COMMUNITY)
Admission: AD | Admit: 2021-03-25 | Discharge: 2021-03-26 | Disposition: A | Discharge status: Home / Self Care | Payer: Medicaid Other | Attending: Obstetrics & Gynecology | Admitting: Obstetrics & Gynecology

## 2021-03-25 DIAGNOSIS — Z87891 Personal history of nicotine dependence: Secondary | ICD-10-CM | POA: Insufficient documentation

## 2021-03-25 DIAGNOSIS — Z79899 Other long term (current) drug therapy: Secondary | ICD-10-CM | POA: Insufficient documentation

## 2021-03-25 DIAGNOSIS — O4700 False labor before 37 completed weeks of gestation, unspecified trimester: Secondary | ICD-10-CM

## 2021-03-25 DIAGNOSIS — Z91040 Latex allergy status: Secondary | ICD-10-CM | POA: Insufficient documentation

## 2021-03-25 DIAGNOSIS — O4703 False labor before 37 completed weeks of gestation, third trimester: Secondary | ICD-10-CM | POA: Insufficient documentation

## 2021-03-25 DIAGNOSIS — Z3A35 35 weeks gestation of pregnancy: Secondary | ICD-10-CM | POA: Insufficient documentation

## 2021-03-25 NOTE — MAU Note (Signed)
Pt reports contractions all day, closer together tonight, a lot of pressure. Denies bleeding, reports she had ? Gush of fluid at 2100

## 2021-03-26 DIAGNOSIS — O4703 False labor before 37 completed weeks of gestation, third trimester: Secondary | ICD-10-CM | POA: Diagnosis present

## 2021-03-26 DIAGNOSIS — Z3A35 35 weeks gestation of pregnancy: Secondary | ICD-10-CM

## 2021-03-26 DIAGNOSIS — Z91040 Latex allergy status: Secondary | ICD-10-CM | POA: Diagnosis not present

## 2021-03-26 DIAGNOSIS — Z87891 Personal history of nicotine dependence: Secondary | ICD-10-CM | POA: Diagnosis not present

## 2021-03-26 DIAGNOSIS — Z79899 Other long term (current) drug therapy: Secondary | ICD-10-CM | POA: Diagnosis not present

## 2021-03-26 LAB — URINALYSIS, ROUTINE W REFLEX MICROSCOPIC
Bilirubin Urine: NEGATIVE
Glucose, UA: NEGATIVE mg/dL
Hgb urine dipstick: NEGATIVE
Ketones, ur: NEGATIVE mg/dL
Nitrite: NEGATIVE
Protein, ur: NEGATIVE mg/dL
Specific Gravity, Urine: 1.015 (ref 1.005–1.030)
pH: 6 (ref 5.0–8.0)

## 2021-03-26 NOTE — MAU Provider Note (Signed)
Chief Complaint:  Contractions   Event Date/Time   First Provider Initiated Contact with Patient 03/26/21 0010     HPI: Allison Shaw is a 25 y.o. G2P1001 at 35w4dwho presents to maternity admissions reporting painful contractions all day .  States they are closer now and is feeling pressure. . She reports good fetal movement, denies LOF, vaginal bleeding, vaginal itching/burning, urinary symptoms, h/a, dizziness, n/v, diarrhea, constipation or fever/chills.  She denies headache, visual changes or RUQ abdominal pain.  Abdominal Pain This is a recurrent problem. The current episode started today. The onset quality is gradual. The problem occurs intermittently. The problem has been gradually worsening. The quality of the pain is cramping. The abdominal pain does not radiate. Pertinent negatives include no constipation, diarrhea, dysuria, myalgias, nausea or vomiting. Nothing aggravates the pain. The pain is relieved by Nothing. She has tried nothing for the symptoms.   RN Note Pt reports contractions all day, closer together tonight, a lot of pressure. Denies bleeding, reports she had ? Gush of fluid at 2100  Past Medical History: Past Medical History:  Diagnosis Date   ADHD (attention deficit hyperactivity disorder)    Bipolar 1 disorder (HCC)    Depression    Mental disorder     Past obstetric history: OB History  Gravida Para Term Preterm AB Living  2 1 1     1  SAB IAB Ectopic Multiple Live Births        0 1    # Outcome Date GA Lbr Len/2nd Weight Sex Delivery Anes PTL Lv  2 Current           1 Term 05/19/16 [redacted]w[redacted]d / 02:15 3657 g F Vag-Vacuum EPI  LIV     Birth Comments: Induced for Postdates with cytotec, foley bulb. 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    Family History: Family History  Problem Relation Age of Onset   Mental  illness Mother    Depression Mother    Bipolar disorder Mother    Diabetes Mother    ADD / ADHD Father     Social History: Social History   Tobacco Use   Smoking status: Former    Types: Cigarettes   Smokeless tobacco: Never   Tobacco comments:    stopped with pregnancy; around others who smoke  Vaping Use   Vaping Use: Former   Quit date: 02/22/2020   Substances: Nicotine  Substance Use Topics   Alcohol use: Not Currently    Comment: occasionally; stopped when found out pregnant- drank alot on New Years    Drug use: No    Allergies:  Allergies  Allergen Reactions   Latex Itching and Other (See Comments)    Reaction:  Burning    Fish Allergy Rash    Meds:  Medications Prior to Admission  Medication Sig Dispense Refill Last Dose   bisacodyl (DULCOLAX) 5 MG EC tablet Take 5 mg by mouth daily as needed for moderate constipation.      Blood Pressure Monitoring (BLOOD PRESSURE KIT) DEVI 1 Device by Does not apply route as needed. 1 each 0    docusate sodium (COLACE) 100 MG capsule Take 1 capsule (100 mg total) by mouth 2 (two) times daily. 30 capsule 0    pantoprazole (PROTONIX) 40 MG tablet Take 1 tablet (40 mg total) by mouth daily. 30 tablet   0    Prenatal Vit-Fe Fumarate-FA (PRENATAL VITAMINS PO) Take 1 tablet by mouth daily.      promethazine (PHENERGAN) 25 MG tablet Take 1 tablet (25 mg total) by mouth every 6 (six) hours as needed for nausea or vomiting. 30 tablet 0    traMADol (ULTRAM) 50 MG tablet Take 1-2 tablets (50-100 mg total) by mouth every 6 (six) hours as needed. (Patient not taking: Reported on 03/16/2021) 6 tablet 0     I have reviewed patient's Past Medical Hx, Surgical Hx, Family Hx, Social Hx, medications and allergies.   ROS:  Review of Systems  Gastrointestinal:  Positive for abdominal pain. Negative for constipation, diarrhea, nausea and vomiting.  Genitourinary:  Negative for dysuria.  Musculoskeletal:  Negative for myalgias.  Other systems  negative  Physical Exam  Patient Vitals for the past 24 hrs:  BP Temp Temp src Pulse Resp SpO2 Height Weight  03/25/21 2341 133/71 98.9 F (37.2 C) Oral (!) 109 18 98 % 5' 3" (1.6 m) 75.3 kg   Constitutional: Well-developed, well-nourished female in no acute distress.  Cardiovascular: normal rate and rhythm Respiratory: normal effort GI: Abd soft, non-tender, gravid appropriate for gestational age.   No rebound or guarding. MS: Extremities nontender, no edema, normal ROM Neurologic: Alert and oriented x 4.  GU: Neg CVAT.  PELVIC EXAM:    Cervix:  1cm external os/closed internal os, long, ballotable Edges of cervix are irregular.  Repeat cervical exam 2 hours later was unchanged  FHT:  Baseline 140 , moderate variability, accelerations present, no decelerations Contractions: Irregular     Labs: Results for orders placed or performed during the hospital encounter of 03/25/21 (from the past 24 hour(s))  Urinalysis, Routine w reflex microscopic Urine, Clean Catch     Status: Abnormal   Collection Time: 03/25/21 11:41 PM  Result Value Ref Range   Color, Urine YELLOW YELLOW   APPearance CLEAR CLEAR   Specific Gravity, Urine 1.015 1.005 - 1.030   pH 6.0 5.0 - 8.0   Glucose, UA NEGATIVE NEGATIVE mg/dL   Hgb urine dipstick NEGATIVE NEGATIVE   Bilirubin Urine NEGATIVE NEGATIVE   Ketones, ur NEGATIVE NEGATIVE mg/dL   Protein, ur NEGATIVE NEGATIVE mg/dL   Nitrite NEGATIVE NEGATIVE   Leukocytes,Ua TRACE (A) NEGATIVE   WBC, UA 0-5 0 - 5 WBC/hpf   Bacteria, UA RARE (A) NONE SEEN   Squamous Epithelial / LPF 0-5 0 - 5   Mucus PRESENT     A/Positive/-- (02/22 1129)  Imaging:    MAU Course/MDM: I have ordered labs and reviewed results. UA is clear NST reviewed, reactive throughout   Treatments in MAU included EFM for 2-3 hours.  No change in cervix over time.  Discussed these contractions are preterm and offered tocolysis, which she declined.  Discussed possibility that baby could  have respiratory challenges if born at 35 wks.  Patient wants to deliver early "so that it wont be as big as the last baby".    Assessment: Single IUP at [redacted]w[redacted]d Preterm uterine contractions No change in cervix  Plan: Discharge home Preterm Labor precautions and fetal kick counts Follow up in Office for prenatal visits  Encouraged to return if she develops worsening of symptoms, increase in pain, fever, or other concerning symptoms.  Pt stable at time of discharge.    CNM, MSN Certified Nurse-Midwife 03/26/2021 12:10 AM 

## 2021-03-30 ENCOUNTER — Other Ambulatory Visit (HOSPITAL_COMMUNITY)
Admission: RE | Admit: 2021-03-30 | Discharge: 2021-03-30 | Disposition: A | Discharge status: Home / Self Care | Payer: Medicaid Other | Source: Ambulatory Visit | Attending: Family Medicine | Admitting: Family Medicine

## 2021-03-30 ENCOUNTER — Ambulatory Visit (INDEPENDENT_AMBULATORY_CARE_PROVIDER_SITE_OTHER): Payer: Medicaid Other | Admitting: Family Medicine

## 2021-03-30 ENCOUNTER — Other Ambulatory Visit: Payer: Self-pay

## 2021-03-30 VITALS — BP 110/73 | HR 96 | Wt 165.5 lb

## 2021-03-30 DIAGNOSIS — Z3A36 36 weeks gestation of pregnancy: Secondary | ICD-10-CM | POA: Insufficient documentation

## 2021-03-30 DIAGNOSIS — Z3483 Encounter for supervision of other normal pregnancy, third trimester: Secondary | ICD-10-CM | POA: Insufficient documentation

## 2021-03-30 DIAGNOSIS — Z3493 Encounter for supervision of normal pregnancy, unspecified, third trimester: Secondary | ICD-10-CM

## 2021-03-30 DIAGNOSIS — Z3009 Encounter for other general counseling and advice on contraception: Secondary | ICD-10-CM

## 2021-03-30 DIAGNOSIS — Z8759 Personal history of other complications of pregnancy, childbirth and the puerperium: Secondary | ICD-10-CM

## 2021-03-30 DIAGNOSIS — Z2839 Other underimmunization status: Secondary | ICD-10-CM

## 2021-03-30 NOTE — Progress Notes (Signed)
    Subjective:  Allison Shaw is a 25 y.o. G2P1001 at [redacted]w[redacted]d being seen today for ongoing prenatal care.  She is currently monitored for the following issues for this low-risk pregnancy and has Bipolar 2 disorder (HCC); Attention deficit hyperactivity disorder (ADHD); Supervision of low-risk pregnancy; History of vacuum extraction assisted delivery; Rubella non-immune status, antepartum; Hx of fetal anomaly in prior pregnancy, currently pregnant; and Unwanted fertility on their problem list.  Patient reports backache and contractions since last week, irregular but having some everyday .  Contractions: Irritability. Vag. Bleeding: None.  Movement: Present. Denies leaking of fluid.   The following portions of the patient's history were reviewed and updated as appropriate: allergies, current medications, past family history, past medical history, past social history, past surgical history and problem list. Problem list updated.  Objective:   Vitals:   03/30/21 1034  BP: 110/73  Pulse: 96  Weight: 165 lb 8 oz (75.1 kg)    Fetal Status: Fetal Heart Rate (bpm): 136   Movement: Present     General:  Alert, oriented and cooperative. Patient is in no acute distress.  Skin: Skin is warm and dry. No rash noted.   Cardiovascular: Normal heart rate noted  Respiratory: Normal respiratory effort, no problems with respiration noted  Abdomen: Soft, gravid, appropriate for gestational age. Pain/Pressure: Present     Pelvic: Vag. Bleeding: None     Cervical exam performed , cervix closed and posterior      Extremities: Normal range of motion.  Edema: Trace  Mental Status: Normal mood and affect. Normal behavior. Normal judgment and thought content.   Urinalysis:      Assessment and Plan:  Pregnancy: G2P1001 at [redacted]w[redacted]d  1. [redacted] weeks gestation of pregnancy 2. Encounter for supervision of low-risk pregnancy in third trimester Having some contractions that are irregular, no signs of labor at this time.  Discussed last growth scan that showed EFW of 97%ile. Patient inquiring about induction at this time or cesarean section given likely macrosomia - we discussed that she is still preterm and there is no medical indication at this time for an induction.  -Discussed that if no other medically indicated reasons for induction become apparent elective inductions can be offered beginning at 39 weeks. - Counseled that guidelines recommend prophylactic cesarean sections only if Wt is >5000gm for patient's without GDM.  - patient expressed understanding of above counseling - Culture, beta strep (group b only) - GC/Chlamydia probe amp ([place])not at [facility]   3. History of vacuum extraction assisted delivery Per delivery summary of last delivery hx of VAVD and second degree laceration.  4. Rubella non-immune status, antepartum Plan for PP rubella vaccine  5. Unwanted fertility BTL planned PP. Papers signed on 4/20  Preterm labor symptoms and general obstetric precautions including but not limited to vaginal bleeding, contractions, leaking of fluid and fetal movement were reviewed in detail with the patient. Please refer to After Visit Summary for other counseling recommendations.  Return in about 1 week (around 04/06/2021) for LROB.   Warner Mccreedy, MD, MPH OB Fellow, Faculty Practice

## 2021-03-31 LAB — GC/CHLAMYDIA PROBE AMP (~~LOC~~) NOT AT ARMC
Chlamydia: NEGATIVE
Comment: NEGATIVE
Comment: NORMAL
Neisseria Gonorrhea: NEGATIVE

## 2021-04-03 LAB — CULTURE, BETA STREP (GROUP B ONLY): Strep Gp B Culture: NEGATIVE

## 2021-04-05 ENCOUNTER — Other Ambulatory Visit: Payer: Self-pay

## 2021-04-05 ENCOUNTER — Encounter (HOSPITAL_COMMUNITY): Payer: Self-pay | Admitting: Obstetrics & Gynecology

## 2021-04-05 ENCOUNTER — Inpatient Hospital Stay (HOSPITAL_COMMUNITY)
Admission: AD | Admit: 2021-04-05 | Discharge: 2021-04-06 | Disposition: A | Discharge status: Home / Self Care | Payer: Medicaid Other | Attending: Family Medicine | Admitting: Family Medicine

## 2021-04-05 ENCOUNTER — Inpatient Hospital Stay (HOSPITAL_COMMUNITY): Payer: Medicaid Other

## 2021-04-05 DIAGNOSIS — Z3A37 37 weeks gestation of pregnancy: Secondary | ICD-10-CM | POA: Diagnosis not present

## 2021-04-05 DIAGNOSIS — Z87891 Personal history of nicotine dependence: Secondary | ICD-10-CM | POA: Insufficient documentation

## 2021-04-05 DIAGNOSIS — F319 Bipolar disorder, unspecified: Secondary | ICD-10-CM | POA: Diagnosis not present

## 2021-04-05 DIAGNOSIS — O99343 Other mental disorders complicating pregnancy, third trimester: Secondary | ICD-10-CM | POA: Insufficient documentation

## 2021-04-05 DIAGNOSIS — Z3493 Encounter for supervision of normal pregnancy, unspecified, third trimester: Secondary | ICD-10-CM

## 2021-04-05 DIAGNOSIS — Z79899 Other long term (current) drug therapy: Secondary | ICD-10-CM | POA: Diagnosis not present

## 2021-04-05 DIAGNOSIS — R519 Headache, unspecified: Secondary | ICD-10-CM | POA: Diagnosis not present

## 2021-04-05 DIAGNOSIS — O26893 Other specified pregnancy related conditions, third trimester: Secondary | ICD-10-CM | POA: Insufficient documentation

## 2021-04-05 DIAGNOSIS — F909 Attention-deficit hyperactivity disorder, unspecified type: Secondary | ICD-10-CM | POA: Insufficient documentation

## 2021-04-05 LAB — URINALYSIS, ROUTINE W REFLEX MICROSCOPIC
Bilirubin Urine: NEGATIVE
Glucose, UA: NEGATIVE mg/dL
Hgb urine dipstick: NEGATIVE
Ketones, ur: NEGATIVE mg/dL
Nitrite: NEGATIVE
Protein, ur: NEGATIVE mg/dL
Specific Gravity, Urine: 1.004 — ABNORMAL LOW (ref 1.005–1.030)
pH: 7 (ref 5.0–8.0)

## 2021-04-05 MED ORDER — ACETAMINOPHEN 500 MG PO TABS
1000.0000 mg | ORAL_TABLET | Freq: Once | ORAL | Status: AC
  Administered 2021-04-05: 1000 mg via ORAL
  Filled 2021-04-05: qty 2

## 2021-04-05 MED ORDER — METOCLOPRAMIDE HCL 5 MG/ML IJ SOLN
10.0000 mg | Freq: Once | INTRAMUSCULAR | Status: AC
  Administered 2021-04-05: 10 mg via INTRAVENOUS
  Filled 2021-04-05: qty 2

## 2021-04-05 MED ORDER — DIPHENHYDRAMINE HCL 50 MG/ML IJ SOLN
12.5000 mg | Freq: Once | INTRAMUSCULAR | Status: AC
  Administered 2021-04-05: 12.5 mg via INTRAVENOUS
  Filled 2021-04-05: qty 1

## 2021-04-05 MED ORDER — BUTALBITAL-APAP-CAFFEINE 50-325-40 MG PO TABS
2.0000 | ORAL_TABLET | Freq: Once | ORAL | Status: AC
  Administered 2021-04-05: 2 via ORAL
  Filled 2021-04-05: qty 2

## 2021-04-05 MED ORDER — LACTATED RINGERS IV BOLUS
1000.0000 mL | Freq: Once | INTRAVENOUS | Status: AC
  Administered 2021-04-05: 1000 mL via INTRAVENOUS

## 2021-04-05 NOTE — MAU Provider Note (Addendum)
History     CSN: [id number]  Arrival date and time: 04/05/21 1647   Event Date/Time   First Provider Initiated Contact with Patient 04/05/21 1759      Chief Complaint  Patient presents with   Headache   Dizziness   HPI  Allison Shaw is a 25 y.o. female G2P1001 @ [redacted]w[redacted]d here in MAU with complaints of HA. Reports HA has been present for 2-3 weeks. She took tylenol 1 gram @ 1500. Reports the HA improved for 30 minutes then returned. The HA is located in the front of her HA and radiates around to her neck.  This headache is different than HA's she has had in the past, however not the worse headache she has ever had. No history of migraines. She rates her HA pain 6/10. Feels like she is staying well hydration.    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    Family History  Problem Relation Age of Onset   Mental illness Mother    Depression Mother    Bipolar disorder Mother    Diabetes Mother    ADD / ADHD Father     Social History   Tobacco Use   Smoking status: Former    Types: Cigarettes   Smokeless tobacco: Never   Tobacco comments:    stopped with pregnancy; around others who smoke  Vaping Use   Vaping Use: Former   Quit date: 02/22/2020   Substances: Nicotine  Substance Use Topics   Alcohol use: Not Currently    Comment: occasionally; stopped when found out pregnant- drank alot on New Years    Drug use: No    Allergies:  Allergies  Allergen Reactions   Latex Itching and Other (See Comments)    Reaction:  Burning    Fish Allergy Rash    Medications Prior  to Admission  Medication Sig Dispense Refill Last Dose   calcium carbonate (TUMS - DOSED IN MG ELEMENTAL CALCIUM) 500 MG chewable tablet Chew 1 tablet by mouth daily.   04/04/2021   famotidine (PEPCID) 10 MG tablet Take 10 mg by mouth 2 (two) times daily.   04/04/2021   Prenatal Vit-Fe Fumarate-FA (PRENATAL VITAMINS PO) Take 1 tablet by mouth daily.   04/04/2021   promethazine (PHENERGAN) 25 MG tablet Take 1 tablet (25 mg total) by mouth every 6 (six) hours as needed for nausea or vomiting. 30 tablet 0 Past Month   bisacodyl (DULCOLAX) 5 MG EC tablet Take 5 mg by mouth daily as needed for moderate constipation. (Patient not taking: Reported on 03/30/2021)      Blood Pressure Monitoring (BLOOD PRESSURE KIT) DEVI 1 Device by Does not apply route as needed. (Patient not taking: Reported on 03/30/2021) 1 each 0    docusate sodium (COLACE)  100 MG capsule Take 1 capsule (100 mg total) by mouth 2 (two) times daily. 30 capsule 0    pantoprazole (PROTONIX) 40 MG tablet Take 1 tablet (40 mg total) by mouth daily. 30 tablet 0    traMADol (ULTRAM) 50 MG tablet Take 1-2 tablets (50-100 mg total) by mouth every 6 (six) hours as needed. (Patient not taking: Reported on 03/30/2021) 6 tablet 0    Results for orders placed or performed during the hospital encounter of 04/05/21 (from the past 48 hour(s))  Urinalysis, Routine w reflex microscopic Urine, Clean Catch     Status: Abnormal   Collection Time: 04/05/21  6:04 PM  Result Value Ref Range   Color, Urine STRAW (A) YELLOW   APPearance HAZY (A) CLEAR   Specific Gravity, Urine 1.004 (L) 1.005 - 1.030   pH 7.0 5.0 - 8.0   Glucose, UA NEGATIVE NEGATIVE mg/dL   Hgb urine dipstick NEGATIVE NEGATIVE   Bilirubin Urine NEGATIVE NEGATIVE   Ketones, ur NEGATIVE NEGATIVE mg/dL   Protein, ur NEGATIVE NEGATIVE mg/dL   Nitrite NEGATIVE NEGATIVE   Leukocytes,Ua LARGE (A) NEGATIVE   RBC / HPF 0-5 0 - 5 RBC/hpf   WBC, UA 0-5 0 - 5 WBC/hpf   Bacteria, UA RARE (A) NONE SEEN    Squamous Epithelial / LPF 6-10 0 - 5   Mucus PRESENT     Comment: Performed at [facility], [address], [place], [place] [address]     Review of Systems  Eyes:  Positive for photophobia. Negative for visual disturbance.  Gastrointestinal:  Positive for nausea. Negative for vomiting.  Neurological:  Positive for headaches.  Physical Exam   Blood pressure 113/64, pulse 91, temperature 98.5 F (36.9 C), temperature source Oral, resp. rate 18, height _0  (1.6 m), weight 76.6 kg, last menstrual period 05/24/2020, SpO2 97 %.  Physical Exam Constitutional:      General: She is not in acute distress.    Appearance: She is well-developed. She is not ill-appearing, toxic-appearing or diaphoretic.  HENT:     Head: Normocephalic.  Eyes:     Extraocular Movements: Extraocular movements intact.  Musculoskeletal:     Cervical back: Normal range of motion.  Skin:    General: Skin is warm.  Neurological:     General: No focal deficit present.     Mental Status: She is alert and oriented to person, place, and time.     GCS: GCS eye subscore is 4. GCS verbal subscore is 5. GCS motor subscore is 6.     Motor: Motor function is intact.     Coordination: Coordination is intact.     Deep Tendon Reflexes: Reflexes normal.   Fetal Tracing: Baseline: 130 bpm Variability: Moderate  Accelerations: 15x15 Decelerations: None Toco:  UI  MAU Course  Procedures Results for orders placed or performed during the hospital encounter of 04/05/21 (from the past 24 hour(s))  Urinalysis, Routine w reflex microscopic Urine, Clean Catch     Status: Abnormal   Collection Time: 04/05/21  6:04 PM  Result Value Ref Range   Color, Urine STRAW (A) YELLOW   APPearance HAZY (A) CLEAR   Specific Gravity, Urine 1.004 (L) 1.005 - 1.030   pH 7.0 5.0 - 8.0   Glucose, UA NEGATIVE NEGATIVE mg/dL   Hgb urine dipstick NEGATIVE NEGATIVE   Bilirubin Urine NEGATIVE NEGATIVE   Ketones, ur NEGATIVE NEGATIVE mg/dL    Protein, ur NEGATIVE NEGATIVE mg/dL   Nitrite NEGATIVE NEGATIVE  Leukocytes,Ua LARGE (A) NEGATIVE   RBC / HPF 0-5 0 - 5 RBC/hpf   WBC, UA 0-5 0 - 5 WBC/hpf   Bacteria, UA RARE (A) NONE SEEN   Squamous Epithelial / LPF 6-10 0 - 5   Mucus PRESENT    MR ANGIO HEAD WO CONTRAST  Result Date: 04/06/2021 CLINICAL DATA:  Initial evaluation for acute headache, currently pregnant. EXAM: MRI HEAD WITHOUT CONTRAST MRA HEAD WITHOUT CONTRAST TECHNIQUE: Multiplanar, multi-echo pulse sequences of the brain and surrounding structures were acquired without intravenous contrast. Angiographic images of the Circle of Willis were acquired using MRA technique without intravenous contrast. COMPARISON:  None available. FINDINGS: MRI HEAD FINDINGS Brain: Cerebral volume within normal limits for patient age. No focal parenchymal signal abnormality identified. No abnormal foci of restricted diffusion to suggest acute or subacute ischemia. Gray-white matter differentiation well maintained. No encephalomalacia to suggest chronic infarction. No foci of susceptibility artifact to suggest acute or chronic intracranial hemorrhage. No mass lesion, midline shift or mass effect. No hydrocephalus. No extra-axial fluid collection. Pituitary gland and suprasellar region are normal. Midline structures intact and normal. Vascular: Major intracranial vascular flow voids well maintained. Major dural sinuses appear grossly patent. Skull and upper cervical spine: Craniocervical junction normal. Visualized upper cervical spine within normal limits. Diffusely decreased T1 signal intensity seen within the visualized bone marrow, likely related to current pregnancy. No focal marrow replacing lesion. No scalp soft tissue abnormality. Sinuses/Orbits: Globes and orbital soft tissues within normal limits. Paranasal sinuses are clear. No mastoid effusion. Inner ear structures normal. Other: None. MRA HEAD FINDINGS Anterior circulation: Both internal  carotid arteries widely patent to the termini without stenosis. A1 segments widely patent. Normal anterior communicating artery complex. Both anterior cerebral arteries widely patent to their distal aspects without stenosis. No M1 stenosis or occlusion. Normal MCA bifurcations. Distal MCA branches well perfused and symmetric. Posterior circulation: Both V4 segments patent to the vertebrobasilar junction without stenosis. Left PICA origin patent and normal. Right PICA not visualized. Basilar widely patent to its distal aspect without stenosis. Superior cerebellar arteries patent bilaterally. Both PCAs primarily supplied via the basilar and are well perfused to there distal aspects. Anatomic variants: None significant. No aneurysm or other vascular abnormality. IMPRESSION: 1. Normal brain MRI. No acute intracranial abnormality or findings to explain patient's symptoms identified. 2. Normal intracranial MRA. Electronically Signed   By: Jeannine Boga M.D.   On: 04/06/2021 00:14   MR BRAIN WO CONTRAST  Result Date: 04/06/2021 CLINICAL DATA:  Initial evaluation for acute headache, currently pregnant. EXAM: MRI HEAD WITHOUT CONTRAST MRA HEAD WITHOUT CONTRAST TECHNIQUE: Multiplanar, multi-echo pulse sequences of the brain and surrounding structures were acquired without intravenous contrast. Angiographic images of the Circle of Willis were acquired using MRA technique without intravenous contrast. COMPARISON:  None available. FINDINGS: MRI HEAD FINDINGS Brain: Cerebral volume within normal limits for patient age. No focal parenchymal signal abnormality identified. No abnormal foci of restricted diffusion to suggest acute or subacute ischemia. Gray-white matter differentiation well maintained. No encephalomalacia to suggest chronic infarction. No foci of susceptibility artifact to suggest acute or chronic intracranial hemorrhage. No mass lesion, midline shift or mass effect. No hydrocephalus. No extra-axial fluid  collection. Pituitary gland and suprasellar region are normal. Midline structures intact and normal. Vascular: Major intracranial vascular flow voids well maintained. Major dural sinuses appear grossly patent. Skull and upper cervical spine: Craniocervical junction normal. Visualized upper cervical spine within normal limits. Diffusely decreased T1 signal intensity seen within the visualized bone  marrow, likely related to current pregnancy. No focal marrow replacing lesion. No scalp soft tissue abnormality. Sinuses/Orbits: Globes and orbital soft tissues within normal limits. Paranasal sinuses are clear. No mastoid effusion. Inner ear structures normal. Other: None. MRA HEAD FINDINGS Anterior circulation: Both internal carotid arteries widely patent to the termini without stenosis. A1 segments widely patent. Normal anterior communicating artery complex. Both anterior cerebral arteries widely patent to their distal aspects without stenosis. No M1 stenosis or occlusion. Normal MCA bifurcations. Distal MCA branches well perfused and symmetric. Posterior circulation: Both V4 segments patent to the vertebrobasilar junction without stenosis. Left PICA origin patent and normal. Right PICA not visualized. Basilar widely patent to its distal aspect without stenosis. Superior cerebellar arteries patent bilaterally. Both PCAs primarily supplied via the basilar and are well perfused to there distal aspects. Anatomic variants: None significant. No aneurysm or other vascular abnormality. IMPRESSION: 1. Normal brain MRI. No acute intracranial abnormality or findings to explain patient's symptoms identified. 2. Normal intracranial MRA. Electronically Signed   By: Jeannine Boga M.D.   On: 04/06/2021 00:14     MDM  Fioricet given 2 tablets Pain not improved, she now rates her HA pain 7/10; neuro exam normal. No deficits, she is A&0 x 3 Reviewed patient with Dr. Nehemiah Settle; benadryl, reglan and IV fluid bolus given. Pain  now 5/10 with photophobia,  no improvement MR angio head ordered. No history of migraines/ headaches.  Report given to Jorje Guild NP who resumes care of the patient.   Rasch, Artist Pais, NP   Negative MRI Headache down to 1/10 prior to discharge.  Discussed treatment of future headaches at home & reasons to return to MAU.  Assessment and Plan   1. Headache in pregnancy, antepartum, third trimester   2. [redacted] weeks gestation of pregnancy    -rx reglan & flexeril to take with tylenol for future headaches -return to MAU prn  Jorje Guild, NP

## 2021-04-05 NOTE — MAU Note (Signed)
Been having severe headaches for 2-3 wks. Took Tylenol ES, was gone for , then returned.  Has been having swelling. BP has been higher for her.  Blurred vision, getting light headed and dizzy. Denies epigastric pain, reports increase in swelling in ankles.

## 2021-04-06 ENCOUNTER — Ambulatory Visit (INDEPENDENT_AMBULATORY_CARE_PROVIDER_SITE_OTHER): Payer: Medicaid Other | Admitting: Certified Nurse Midwife

## 2021-04-06 VITALS — BP 111/71 | HR 112 | Wt 167.3 lb

## 2021-04-06 DIAGNOSIS — O99019 Anemia complicating pregnancy, unspecified trimester: Secondary | ICD-10-CM

## 2021-04-06 DIAGNOSIS — Z3A37 37 weeks gestation of pregnancy: Secondary | ICD-10-CM

## 2021-04-06 DIAGNOSIS — Z3493 Encounter for supervision of normal pregnancy, unspecified, third trimester: Secondary | ICD-10-CM

## 2021-04-06 DIAGNOSIS — D509 Iron deficiency anemia, unspecified: Secondary | ICD-10-CM

## 2021-04-06 LAB — CBC
Hematocrit: 29.5 % — ABNORMAL LOW (ref 34.0–46.6)
Hemoglobin: 9.3 g/dL — ABNORMAL LOW (ref 11.1–15.9)
MCH: 25.1 pg — ABNORMAL LOW (ref 26.6–33.0)
MCHC: 31.5 g/dL (ref 31.5–35.7)
MCV: 80 fL (ref 79–97)
Platelets: 162 10*3/uL (ref 150–450)
RBC: 3.71 x10E6/uL — ABNORMAL LOW (ref 3.77–5.28)
RDW: 15.3 % (ref 11.7–15.4)
WBC: 9.5 10*3/uL (ref 3.4–10.8)

## 2021-04-06 MED ORDER — CYCLOBENZAPRINE HCL 5 MG PO TABS: 5.0000 mg | ORAL_TABLET | Freq: Three times a day (TID) | ORAL | 0 refills | Status: DC | PRN

## 2021-04-06 MED ORDER — METOCLOPRAMIDE HCL 10 MG PO TABS: 10.0000 mg | ORAL_TABLET | Freq: Three times a day (TID) | ORAL | 0 refills | Status: DC | PRN

## 2021-04-06 NOTE — Discharge Instructions (Signed)
For prevention of migraines in pregnancy: -Magnesium, 400mg by mouth, once daily -Vitamin B2, 400mg by mouth, once daily  For treatment of migraines in pregnancy: -take medication at the first sign of the pain of a headache, or the first sign of your aura -start with 1000mg Tylenol (do not exceed 4000mg of Tylenol in 24hrs), with or without Reglan 10mg -if no relief after 1-2hours, can take Flexeril 10mg     

## 2021-04-07 NOTE — Progress Notes (Signed)
   PRENATAL VISIT NOTE  Subjective:  Allison Shaw is a 25 y.o. G2P1001 at [redacted]w[redacted]d being seen today for ongoing prenatal care.  She is currently monitored for the following issues for this low-risk pregnancy and has Bipolar 2 disorder (HCC); Attention deficit hyperactivity disorder (ADHD); Supervision of low-risk pregnancy; History of vacuum extraction assisted delivery; Rubella non-immune status, antepartum; Hx of fetal anomaly in prior pregnancy, currently pregnant; and Unwanted fertility on their problem list.  Patient reports backache, fatigue, headache, occasional contractions, and strongly desires IOL as soon as possible. She had a traumatic delivery (vacuum assisted, reports the cord "broke off inside me so the baby was blue and they rushed her away from me") and is desperate to avoid this kind of delivery again. Has been told this baby is big (at her last u/s) and is concerned she will end up with a Cesarean. Also reports feeling exhausted all the time, having pelvic pain when she walks, mild headaches (that resolve without meds), foot and ankle swelling, and high blood pressure (for her) .  Contractions: Irritability. Vag. Bleeding: None.  Movement: Present. Denies leaking of fluid.   The following portions of the patient's history were reviewed and updated as appropriate: allergies, current medications, past family history, past medical history, past social history, past surgical history and problem list.   Objective:   Vitals:   04/06/21 1412  BP: 111/71  Pulse: (!) 112  Weight: 167 lb 4.8 oz (75.9 kg)    Fetal Status:     Movement: Present     General:  Alert, oriented and cooperative. Patient is in no acute distress.  Skin: Skin is warm and dry. No rash noted.   Cardiovascular: Normal heart rate noted  Respiratory: Normal respiratory effort, no problems with respiration noted  Abdomen: Soft, gravid, appropriate for gestational age.  Pain/Pressure: Absent     Pelvic: Cervical  exam deferred        Extremities: Normal range of motion.  Edema: Trace  Mental Status: Normal mood and affect. Normal behavior. Normal judgment and thought content.   Assessment and Plan:  Pregnancy: G2P1001 at [redacted]w[redacted]d 1. Encounter for supervision of low-risk pregnancy in third trimester - Feeling regular and vigorous fetal movement. - Listened to concerns and validated emotions. - CBC drawn to test for anemia given c/o exhaustion  2. 37 weeks of gestation - Routine OB care - Advised we cannot do an elective IOL until 39wks unless there is a medical indication - Clarified advice given by previous MD (in previous pregnancy) to always deliver early so she avoids a baby getting "too big". Advised that waiting until SOL with consistent position changes, etc is what prevents CS best. Pt calmed and was more amenable to waiting.  - Reviewed results of growth scan on 03/23/21 which showed appropriate growth and amniotic fluid. No MFM recommendation for early delivery.  Term labor symptoms and general obstetric precautions including but not limited to vaginal bleeding, contractions, leaking of fluid and fetal movement were reviewed in detail with the patient. Please refer to After Visit Summary for other counseling recommendations.   No follow-ups on file.  Future Appointments  Date Time Provider Department Center  04/14/2021  2:15 PM Bernerd Limbo, CNM [facility]    Bernerd Limbo, CNM

## 2021-04-08 ENCOUNTER — Encounter: Payer: Self-pay | Admitting: *Deleted

## 2021-04-09 ENCOUNTER — Encounter (HOSPITAL_COMMUNITY): Payer: Self-pay | Admitting: Obstetrics and Gynecology

## 2021-04-09 ENCOUNTER — Inpatient Hospital Stay (HOSPITAL_COMMUNITY)
Admission: AD | Admit: 2021-04-09 | Discharge: 2021-04-09 | Disposition: A | Discharge status: Home / Self Care | Payer: Medicaid Other | Attending: Obstetrics and Gynecology | Admitting: Obstetrics and Gynecology

## 2021-04-09 DIAGNOSIS — Z3689 Encounter for other specified antenatal screening: Secondary | ICD-10-CM | POA: Diagnosis present

## 2021-04-09 DIAGNOSIS — Z0371 Encounter for suspected problem with amniotic cavity and membrane ruled out: Secondary | ICD-10-CM | POA: Diagnosis not present

## 2021-04-09 DIAGNOSIS — Z3A37 37 weeks gestation of pregnancy: Secondary | ICD-10-CM

## 2021-04-09 DIAGNOSIS — Z3493 Encounter for supervision of normal pregnancy, unspecified, third trimester: Secondary | ICD-10-CM

## 2021-04-09 LAB — POCT FERN TEST: POCT Fern Test: NEGATIVE

## 2021-04-09 MED ORDER — POLYSACCHARIDE IRON COMPLEX 150 MG PO CAPS: 150.0000 mg | ORAL_CAPSULE | Freq: Every day | ORAL | 0 refills | Status: DC

## 2021-04-09 NOTE — MAU Note (Signed)
AT 720- PT SAYS SHE WAS SITTING ON YOGA BALL AND UNDERWEAR WET - DID NOT FEEL A GUSH  [facility] - CLINIC VE AT 36 WEEKS -UNSURE  DENIES HSV GBS- NEG FEELS  STRONG UC'S - 7PM

## 2021-04-09 NOTE — Addendum Note (Signed)
Addended by: Edd Arbour on: 04/09/2021 07:08 PM   Modules accepted: Orders

## 2021-04-09 NOTE — MAU Note (Signed)
I have communicated with Gerrit Heck, CNM and reviewed vital signs:  Vitals:   04/09/21 2044 04/09/21 2307  BP: 130/66 119/73  Pulse: (!) 105 (!) 109  Resp: 18 18  Temp: 98 F (36.7 C)   SpO2:  100%    Vaginal exam:  Dilation: 1.5 Effacement (%): 10 Cervical Position: Posterior Station: -3 Presentation: Vertex Exam by:: TLYTLE RN,   Also reviewed contraction pattern and that non-stress test is reactive.  It has been documented that patient is contracting Irregularly  with minimal cervical change over 1 hours not indicating active labor.  Patient denies any other complaints.  Based on this report provider has given order for discharge.  A discharge order and diagnosis entered by a provider.   Labor discharge instructions reviewed with patient.

## 2021-04-09 NOTE — Progress Notes (Signed)
Pt states she needs induction because "she can sleep and she has a 25 year old at home."  Is not taking any pain medicine or anything to help her sleep. Also says that her baby is measuring "really big" and that she can't go to 40 weeks.

## 2021-04-09 NOTE — MAU Provider Note (Signed)
None      S: [name] is a 26 y.o. G2P1001 at [redacted]w[redacted]d  who presents to MAU today complaining of leaking of fluid since 1900. She denies vaginal bleeding. She endorses contractions. She reports normal fetal movement.    O: BP 130/66 (BP Location: Right Arm)   Pulse (!) 105   Temp 98 F (36.7 C) (Oral)   Resp 18   Ht 5\' 3"  (1.6 m)   Wt 76.6 kg   LMP 05/24/2020   BMI 29.92 kg/m  GENERAL: Well-developed, well-nourished female in no acute distress.  HEAD: Normocephalic, atraumatic.  CHEST: Normal effort of breathing, regular heart rate ABDOMEN: Soft, nontender, gravid PELVIC: Normal external female genitalia. Vagina is pink and rugated. Cervix with normal contour, no lesions. Normal discharge.  Negative pooling. Fern Collected  Cervical exam:  Dilation: 1.5 Effacement (%): 10 Cervical Position: Posterior Station: -3 Presentation: Vertex Exam by:: TLYTLE RN   Fetal Monitoring: FHT: 135 bpm, Mod Var, -Decels, +Accels Toco: Qmin, palpates mild  No results found for this or any previous visit (from the past 24 hour(s)).   A: SIUP at [redacted]w[redacted]d  Membranes intact Cat I FT  P: Fern Negative Nurse instructed to perform vaginal exam. After 1.5 hours no cervical change.] NST Reactive Discharge order placed. Precautions Given.   [redacted]w[redacted]d, CNM 04/09/2021 10:59 PM

## 2021-04-11 ENCOUNTER — Inpatient Hospital Stay (HOSPITAL_COMMUNITY)
Admission: AD | Admit: 2021-04-11 | Discharge: 2021-04-11 | Disposition: A | Discharge status: Home / Self Care | Payer: Medicaid Other | Attending: Obstetrics & Gynecology | Admitting: Obstetrics & Gynecology

## 2021-04-11 ENCOUNTER — Other Ambulatory Visit: Payer: Self-pay

## 2021-04-11 ENCOUNTER — Encounter (HOSPITAL_COMMUNITY): Payer: Self-pay | Admitting: Obstetrics & Gynecology

## 2021-04-11 DIAGNOSIS — O479 False labor, unspecified: Secondary | ICD-10-CM

## 2021-04-11 DIAGNOSIS — O471 False labor at or after 37 completed weeks of gestation: Secondary | ICD-10-CM | POA: Insufficient documentation

## 2021-04-11 DIAGNOSIS — Z3A37 37 weeks gestation of pregnancy: Secondary | ICD-10-CM

## 2021-04-11 DIAGNOSIS — Z0371 Encounter for suspected problem with amniotic cavity and membrane ruled out: Secondary | ICD-10-CM | POA: Insufficient documentation

## 2021-04-11 HISTORY — DX: Anorexia: R63.0

## 2021-04-11 HISTORY — DX: Anemia, unspecified: D64.9

## 2021-04-11 LAB — POCT FERN TEST
POCT Fern Test: NEGATIVE
POCT Fern Test: NEGATIVE

## 2021-04-11 NOTE — MAU Note (Signed)
With SVE. Large amount of stool noted. Cervical funneling to presenting part-indeterminable presenting part. Fern collected and negative

## 2021-04-11 NOTE — MAU Note (Addendum)
Pt states that she is now developing a headache without visual changes

## 2021-04-11 NOTE — MAU Note (Signed)
..  Allison Shaw is a 25 y.o. at [redacted]w[redacted]d here in MAU reporting: Contractions that are every few minutes. She had a gush of fluid at 5am but no leaking since then. +FM.   Pain score: 7/10

## 2021-04-11 NOTE — MAU Provider Note (Signed)
Event Date/Time   First Provider Initiated Contact with Patient 04/11/21 2035       S: Allison Shaw is a 25 y.o. G2P1001 at [redacted]w[redacted]d  who presents to MAU today complaining of leaking of fluid since once this morning - unsure if was amniotic fluid or urine. Leaking has not continued. Reports some contractions & rectal pressure. Denies vaginal bleeding. Good fetal movement.   O: BP 118/66 (BP Location: Left Arm)   Pulse (!) 109   Temp 97.8 F (36.6 C) (Oral)   Resp 19   LMP 05/24/2020   SpO2 100%  GENERAL: Well-developed, well-nourished female in no acute distress.  HEAD: Normocephalic, atraumatic.  CHEST: Normal effort of breathing, regular heart rate ABDOMEN: Soft, nontender, gravid PELVIC: Normal external female genitalia. Vagina is pink and rugated. Cervix with normal contour, no lesions. Normal discharge.  No pooling.   Cervical exam:  Dilation: 1.5 Effacement (%): Thick Cervical Position: Posterior Station: Ballotable Presentation: Undeterminable Exam by:: Judeth Horn NP   Fetal Monitoring: Baseline: 135 Variability: moderate Accelerations: 15x15 Decelerations: none Contractions: irritability  Results for orders placed or performed during the hospital encounter of 04/11/21 (from the past 24 hour(s))  POCT fern test     Status: Normal   Collection Time: 04/11/21  9:23 PM  Result Value Ref Range   POCT Fern Test Negative = intact amniotic membranes      A: SIUP at [redacted]w[redacted]d  Membranes intact  P: Discharge home Labor precautions  Judeth Horn, NP 04/11/2021 9:23 PM

## 2021-04-14 ENCOUNTER — Other Ambulatory Visit: Payer: Self-pay

## 2021-04-14 ENCOUNTER — Ambulatory Visit (INDEPENDENT_AMBULATORY_CARE_PROVIDER_SITE_OTHER): Payer: Medicaid Other | Admitting: Certified Nurse Midwife

## 2021-04-14 ENCOUNTER — Other Ambulatory Visit: Payer: Self-pay | Admitting: Advanced Practice Midwife

## 2021-04-14 VITALS — BP 126/69 | HR 99 | Wt 170.0 lb

## 2021-04-14 DIAGNOSIS — Z3493 Encounter for supervision of normal pregnancy, unspecified, third trimester: Secondary | ICD-10-CM

## 2021-04-14 DIAGNOSIS — Z23 Encounter for immunization: Secondary | ICD-10-CM

## 2021-04-14 DIAGNOSIS — D508 Other iron deficiency anemias: Secondary | ICD-10-CM

## 2021-04-14 DIAGNOSIS — Z3A38 38 weeks gestation of pregnancy: Secondary | ICD-10-CM

## 2021-04-14 NOTE — Progress Notes (Addendum)
   PRENATAL VISIT NOTE  Subjective:  Allison Shaw is a 25 y.o. G2P1001 at [redacted]w[redacted]d being seen today for ongoing prenatal care.  She is currently monitored for the following issues for this low-risk pregnancy and has Bipolar 2 disorder (HCC); Attention deficit hyperactivity disorder (ADHD); Supervision of low-risk pregnancy; History of vacuum extraction assisted delivery; Rubella non-immune status, antepartum; Hx of fetal anomaly in prior pregnancy, currently pregnant; and Unwanted fertility on their problem list.  Patient reports backache, fatigue, and light-headedness .  Contractions: Irritability. Vag. Bleeding: None.  Movement: Present. Denies leaking of fluid.   The following portions of the patient's history were reviewed and updated as appropriate: allergies, current medications, past family history, past medical history, past social history, past surgical history and problem list.   Objective:   Vitals:   04/14/21 1422  BP: 126/69  Pulse: 99  Weight: 170 lb (77.1 kg)   Fetal Status: Fetal Heart Rate (bpm): 142 Fundal Height: 38 cm Movement: Present     General:  Alert, oriented and cooperative. Patient is in no acute distress. No dizziness or light-headedness during visit.  Skin: Skin is warm and dry. No rash noted.   Cardiovascular: Normal heart rate noted  Respiratory: Normal respiratory effort, no problems with respiration noted  Abdomen: Soft, gravid, appropriate for gestational age.  Pain/Pressure: Present     Pelvic: Cervical exam deferred        Extremities: Normal range of motion.  Edema: Trace  Mental Status: Normal mood and affect. Normal behavior. Normal judgment and thought content.   Assessment and Plan:  Pregnancy: G2P1001 at [redacted]w[redacted]d 1. Encounter for supervision of low-risk pregnancy in third trimester - Feeling regular and vigorous fetal movement but having a lot of pelvic pain, concern for size of the baby and delivery, as well as light-headedness with too much  exertion due to anemia.  - Long conversation about the risks of elective IOL at 39wks, pt had a traumatic VAVD and was told by the delivering OB that she should not have a baby vaginally again (per pt). Wants IOL asap, today if possible. Explained that the earliest IOL we can schedule is at 39wks given her lack of medical indication at this time. Pt still prefers earlier but amenable to 39wk IOL. - IOL scheduled for 04/20/21  2. [redacted] weeks gestation of pregnancy - Routine OB care  3. Need for immunization against influenza - Flu Vaccine QUAD 54mo+IM (Fluarix, Fluzone & Alfiuria Quad PF)  4. Iron deficiency anemia due to inadequate dietary iron intake - Taking po iron, will need venofer in the hospital (after delivery)  Term labor symptoms and general obstetric precautions including but not limited to vaginal bleeding, contractions, leaking of fluid and fetal movement were reviewed in detail with the patient. Please refer to After Visit Summary for other counseling recommendations.   Future Appointments  Date Time Provider Department Center  04/20/2021  7:00 AM [facility] SCHED ROOM [facility] None   Bernerd Limbo, CNM

## 2021-04-15 ENCOUNTER — Encounter (HOSPITAL_COMMUNITY): Payer: Self-pay

## 2021-04-15 ENCOUNTER — Encounter (HOSPITAL_COMMUNITY): Payer: Self-pay | Admitting: *Deleted

## 2021-04-15 ENCOUNTER — Telehealth (HOSPITAL_COMMUNITY): Payer: Self-pay | Admitting: *Deleted

## 2021-04-15 NOTE — Telephone Encounter (Signed)
Preadmission screen  

## 2021-04-16 ENCOUNTER — Other Ambulatory Visit: Payer: Self-pay | Admitting: Family Medicine

## 2021-04-16 LAB — SARS CORONAVIRUS 2 (TAT 6-24 HRS): SARS Coronavirus 2: NEGATIVE

## 2021-04-17 ENCOUNTER — Other Ambulatory Visit: Payer: Self-pay | Admitting: Women's Health

## 2021-04-19 ENCOUNTER — Other Ambulatory Visit: Payer: Self-pay | Admitting: Advanced Practice Midwife

## 2021-04-20 ENCOUNTER — Inpatient Hospital Stay (HOSPITAL_COMMUNITY)
Admission: AD | Admit: 2021-04-20 | Discharge: 2021-04-23 | DRG: 807 | Disposition: A | Discharge status: Home / Self Care | Payer: Medicaid Other | Attending: Obstetrics & Gynecology | Admitting: Obstetrics & Gynecology

## 2021-04-20 ENCOUNTER — Encounter (HOSPITAL_COMMUNITY): Payer: Self-pay | Admitting: Family Medicine

## 2021-04-20 ENCOUNTER — Other Ambulatory Visit: Payer: Self-pay

## 2021-04-20 ENCOUNTER — Inpatient Hospital Stay (HOSPITAL_COMMUNITY): Payer: Medicaid Other

## 2021-04-20 DIAGNOSIS — R339 Retention of urine, unspecified: Secondary | ICD-10-CM | POA: Diagnosis not present

## 2021-04-20 DIAGNOSIS — Z2839 Other underimmunization status: Secondary | ICD-10-CM

## 2021-04-20 DIAGNOSIS — Z87891 Personal history of nicotine dependence: Secondary | ICD-10-CM | POA: Diagnosis not present

## 2021-04-20 DIAGNOSIS — O3663X0 Maternal care for excessive fetal growth, third trimester, not applicable or unspecified: Secondary | ICD-10-CM | POA: Diagnosis present

## 2021-04-20 DIAGNOSIS — Z3A39 39 weeks gestation of pregnancy: Secondary | ICD-10-CM | POA: Diagnosis not present

## 2021-04-20 DIAGNOSIS — O99893 Other specified diseases and conditions complicating puerperium: Secondary | ICD-10-CM | POA: Diagnosis not present

## 2021-04-20 DIAGNOSIS — Z23 Encounter for immunization: Secondary | ICD-10-CM

## 2021-04-20 DIAGNOSIS — O9902 Anemia complicating childbirth: Secondary | ICD-10-CM | POA: Diagnosis present

## 2021-04-20 DIAGNOSIS — O09899 Supervision of other high risk pregnancies, unspecified trimester: Secondary | ICD-10-CM

## 2021-04-20 DIAGNOSIS — Z3490 Encounter for supervision of normal pregnancy, unspecified, unspecified trimester: Secondary | ICD-10-CM | POA: Diagnosis present

## 2021-04-20 DIAGNOSIS — F3181 Bipolar II disorder: Secondary | ICD-10-CM | POA: Diagnosis present

## 2021-04-20 DIAGNOSIS — Z3493 Encounter for supervision of normal pregnancy, unspecified, third trimester: Secondary | ICD-10-CM

## 2021-04-20 DIAGNOSIS — D509 Iron deficiency anemia, unspecified: Secondary | ICD-10-CM | POA: Diagnosis present

## 2021-04-20 DIAGNOSIS — Z3009 Encounter for other general counseling and advice on contraception: Secondary | ICD-10-CM | POA: Diagnosis present

## 2021-04-20 DIAGNOSIS — Z8759 Personal history of other complications of pregnancy, childbirth and the puerperium: Secondary | ICD-10-CM

## 2021-04-20 DIAGNOSIS — O09299 Supervision of pregnancy with other poor reproductive or obstetric history, unspecified trimester: Secondary | ICD-10-CM

## 2021-04-20 DIAGNOSIS — O26893 Other specified pregnancy related conditions, third trimester: Secondary | ICD-10-CM | POA: Diagnosis present

## 2021-04-20 LAB — CBC
HCT: 31.1 % — ABNORMAL LOW (ref 36.0–46.0)
Hemoglobin: 9.2 g/dL — ABNORMAL LOW (ref 12.0–15.0)
MCH: 24.5 pg — ABNORMAL LOW (ref 26.0–34.0)
MCHC: 29.6 g/dL — ABNORMAL LOW (ref 30.0–36.0)
MCV: 82.9 fL (ref 80.0–100.0)
Platelets: 182 10*3/uL (ref 150–400)
RBC: 3.75 MIL/uL — ABNORMAL LOW (ref 3.87–5.11)
RDW: 16.3 % — ABNORMAL HIGH (ref 11.5–15.5)
WBC: 6.8 10*3/uL (ref 4.0–10.5)
nRBC: 0 % (ref 0.0–0.2)

## 2021-04-20 LAB — TYPE AND SCREEN
ABO/RH(D): A POS
Antibody Screen: NEGATIVE

## 2021-04-20 MED ORDER — TERBUTALINE SULFATE 1 MG/ML IJ SOLN: 0.2500 mg | Freq: Once | INTRAMUSCULAR | Status: DC | PRN

## 2021-04-20 MED ORDER — SOD CITRATE-CITRIC ACID 500-334 MG/5ML PO SOLN: 30.0000 mL | ORAL | Status: DC | PRN

## 2021-04-20 MED ORDER — OXYCODONE-ACETAMINOPHEN 5-325 MG PO TABS
1.0000 | ORAL_TABLET | ORAL | Status: DC | PRN
  Administered 2021-04-22: 1 via ORAL
  Filled 2021-04-20: qty 1

## 2021-04-20 MED ORDER — FENTANYL CITRATE (PF) 100 MCG/2ML IJ SOLN
100.0000 ug | INTRAMUSCULAR | Status: DC | PRN
  Administered 2021-04-20: 100 ug via INTRAVENOUS
  Filled 2021-04-20: qty 2

## 2021-04-20 MED ORDER — LACTATED RINGERS IV SOLN: 500.0000 mL | INTRAVENOUS | Status: DC | PRN

## 2021-04-20 MED ORDER — OXYTOCIN-SODIUM CHLORIDE 30-0.9 UT/500ML-% IV SOLN
2.5000 [IU]/h | INTRAVENOUS | Status: DC
  Filled 2021-04-20 (×2): qty 500

## 2021-04-20 MED ORDER — OXYTOCIN BOLUS FROM INFUSION
333.0000 mL | Freq: Once | INTRAVENOUS | Status: AC
  Administered 2021-04-21: 333 mL via INTRAVENOUS

## 2021-04-20 MED ORDER — OXYCODONE-ACETAMINOPHEN 5-325 MG PO TABS: 2.0000 | ORAL_TABLET | ORAL | Status: DC | PRN

## 2021-04-20 MED ORDER — MISOPROSTOL 50MCG HALF TABLET
50.0000 ug | ORAL_TABLET | ORAL | Status: DC | PRN
  Administered 2021-04-20 (×2): 50 ug via BUCCAL
  Filled 2021-04-20 (×2): qty 1

## 2021-04-20 MED ORDER — ACETAMINOPHEN 325 MG PO TABS
650.0000 mg | ORAL_TABLET | ORAL | Status: DC | PRN
Start: 2021-04-20 — End: 2021-04-22

## 2021-04-20 MED ORDER — LIDOCAINE HCL (PF) 1 % IJ SOLN: 30.0000 mL | INTRAMUSCULAR | Status: DC | PRN

## 2021-04-20 MED ORDER — CALCIUM CARBONATE ANTACID 500 MG PO CHEW
1.0000 | CHEWABLE_TABLET | Freq: Once | ORAL | Status: AC
  Administered 2021-04-20: 200 mg via ORAL
  Filled 2021-04-20: qty 1

## 2021-04-20 MED ORDER — MISOPROSTOL 25 MCG QUARTER TABLET: 25.0000 ug | ORAL_TABLET | ORAL | Status: DC | PRN

## 2021-04-20 MED ORDER — ONDANSETRON HCL 4 MG/2ML IJ SOLN
4.0000 mg | Freq: Four times a day (QID) | INTRAMUSCULAR | Status: DC | PRN
  Administered 2021-04-20: 4 mg via INTRAVENOUS
  Filled 2021-04-20: qty 2

## 2021-04-20 MED ORDER — LACTATED RINGERS IV SOLN: INTRAVENOUS | Status: DC

## 2021-04-20 NOTE — Progress Notes (Signed)
Labor Progress Note Paisleigh O'Guin is a 25 y.o. G2P1001 at [redacted]w[redacted]d presented for elective IOL.   S: Recently feeling contractions a lot more, having to breathe through them.   O:  BP 114/66   Pulse 88   Temp 98.1 F (36.7 C) (Oral)   Resp 18   Ht 5\' 3"  (1.6 m)   Wt 77.1 kg   LMP 05/24/2020   BMI 30.11 kg/m  EFM: 130/mod/15x15  CVE: Dilation: 0.5-1cm in internal os  Effacement (%): Thick Cervical Position: Posterior Presentation: Vertex Exam by:: Dr  [email or web address]   A&P: 25 y.o. G2P1001 [redacted]w[redacted]d  #Labor: Some cervical change now s/p cytotec. Internal os still extremely posterior with difficulty reaching end. Attempted speculum assisted FB placement with a cook catheter, however unsuccessful. She is contracting about every 2-3 minutes currently and uncomfortable with them, will expectantly monitor for the next 20 or so minutes, if continued pattern will proceed with expectant for now. If spacing, consider additional cytotec x1.    #Pain: Epidural  #FWB: Cat 1  #GBS negative   [redacted]w[redacted]d, DO 6:01 PM

## 2021-04-20 NOTE — Progress Notes (Signed)
Labor Progress Note Malayasia O'Guin is a 25 y.o. G2P1001 at [redacted]w[redacted]d who presented for elective IOL.   S: Patient is doing well. No concerns at this time. Feels like her contractions have more recently spaced out.  O:  BP 114/66   Pulse 88   Temp 98.1 F (36.7 C) (Oral)   Resp 18   Ht 5\' 3"  (1.6 m)   Wt 77.1 kg   LMP 05/24/2020   BMI 30.11 kg/m  EFM: 125 bpm/moderate variability/+accels/no decels  CVE: Dilation: 3 Effacement (%): 50 Cervical Position: Anterior Station: -3 Presentation: Vertex Exam by:: Dr. [email or web address]   A&P: 25 y.o. G2P1001 [redacted]w[redacted]d #Labor: Progressing well since last check. SVE now 3/50/-3. Will proceed with 2nd dose of Cytotec and reassess in 4 hours. #Pain: PRN; planning for epidural #FWB: Cat 1  #GBS negative  [redacted]w[redacted]d, MD 10:36 PM

## 2021-04-20 NOTE — H&P (Signed)
OBSTETRIC ADMISSION HISTORY AND PHYSICAL  Allison Shaw is a 25 y.o. female G2P1001 with IUP at [redacted]w[redacted]d by 10 wk u/s presenting for eIOL. She reports +FMs, No LOF, no VB, no blurry vision, headaches or peripheral edema, and RUQ pain.  She plans on breast feeding. She request BTL for birth control. She received her prenatal [facility]   Dating: By 10 wk u/s --->  Estimated Date of Delivery: 04/26/21  Sono:    @[redacted]w[redacted]d , CWD, normal anatomy, cephalic presentation,  posterior placenta, 3270g, 97% EFW   Prenatal History/Complications:  --iron deficiency anemia --previous vacuum assisted vaginal delivery (unclear indication)   Past Medical History: Past Medical History:  Diagnosis Date   ADHD (attention deficit hyperactivity disorder)    Anemia    Anorexia    Bipolar 1 disorder (HCC)    Depression    Mental disorder    Urinary retention    had to I&Ocath at home after delivery    Past Surgical History: Past Surgical History:  Procedure Laterality Date   EYE SURGERY Bilateral    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    Obstetrical History: OB History     Gravida  2   Para  1   Term  1   Preterm      AB      Living  1      SAB      IAB      Ectopic      Multiple  0   Live Births  1           Social History Social History   Socioeconomic History   Marital status: Married    Spouse name: Not on file   Number of children: Not on file   Years of education: Not on file   Highest education level: Not on file  Occupational History   Not on file  Tobacco Use   Smoking status: Former    Types: Cigarettes   Smokeless tobacco: Never   Tobacco comments:    stopped with pregnancy; around others who smoke  Vaping Use   Vaping Use: Former   Quit date: 02/22/2020   Substances: Nicotine  Substance and Sexual Activity   Alcohol use: Not Currently    Comment: occasionally; stopped when found out pregnant- drank alot on New Years    Drug use: No   Sexual activity: Yes     Comment: states missed a few days of the pill because not good pills ; stopped taking until end of October or early November  Other Topics Concern   Not on file  Social History Narrative   Not on file   Social Determinants of Health   Financial Resource Strain: Not on file  Food Insecurity: No Food Insecurity   Worried About December in the Last Year: Never true   Programme researcher, broadcasting/film/video in the Last Year: Never true  Transportation Needs: No Transportation Needs   Lack of Transportation (Medical): No   Lack of Transportation (Non-Medical): No  Physical Activity: Not on file  Stress: Not on file  Social Connections: Not on file    Family History: Family History  Problem Relation Age of Onset   Mental illness Mother    Depression Mother    Bipolar disorder Mother    Diabetes Mother    ADD / ADHD Father     Allergies: Allergies  Allergen Reactions   Latex Itching and Other (See Comments)  Reaction:  Burning    Fish Allergy Rash    Medications Prior to Admission  Medication Sig Dispense Refill Last Dose   calcium carbonate (TUMS - DOSED IN MG ELEMENTAL CALCIUM) 500 MG chewable tablet Chew 1 tablet by mouth daily.      cyclobenzaprine (FLEXERIL) 5 MG tablet Take 1 tablet (5 mg total) by mouth 3 (three) times daily as needed (headache). 20 tablet 0    docusate sodium (COLACE) 100 MG capsule Take 1 capsule (100 mg total) by mouth 2 (two) times daily. 30 capsule 0    famotidine (PEPCID) 10 MG tablet Take 10 mg by mouth 2 (two) times daily.      iron polysaccharides (NIFEREX) 150 MG capsule Take 1 capsule (150 mg total) by mouth daily. 30 capsule 0    metoCLOPramide (REGLAN) 10 MG tablet Take 1 tablet (10 mg total) by mouth every 8 (eight) hours as needed (headache). 30 tablet 0    pantoprazole (PROTONIX) 40 MG tablet Take 1 tablet (40 mg total) by mouth daily. 30 tablet 0    Prenatal Vit-Fe Fumarate-FA (PRENATAL VITAMINS PO) Take 1 tablet by mouth daily.       promethazine (PHENERGAN) 25 MG tablet Take 1 tablet (25 mg total) by mouth every 6 (six) hours as needed for nausea or vomiting. 30 tablet 0      Review of Systems   All systems reviewed and negative except as stated in HPI  Last menstrual period 05/24/2020. General appearance: alert and cooperative Lungs: Normal WOB  Heart: regular rate and rhythm Abdomen: soft, non-tender; bowel sounds normal Pelvic: NEFG Extremities: Homans sign is negative, no sign of DVT Presentation: cephalic confirmed by bedside US  Fetal monitoringBaseline: 135 bpm, Variability: Good {> 6 bpm), Accelerations: Reactive, and Decelerations: Absent Uterine activityNone     Prenatal labs: ABO, Rh: A/Positive/-- (02/22 1129) Antibody: Negative (02/22 1129) Rubella: <0.90 (02/22 1129) RPR: Non Reactive (06/17 0908)  HBsAg: Negative (02/22 1129)  HIV: Non Reactive (06/17 0908)  GBS: Negative/-- (08/16 1046)  1 hr Glucola normal Genetic screening  normal  Anatomy US normal   Prenatal Transfer Tool  Maternal Diabetes: No Genetic Screening: Normal Maternal Ultrasounds/Referrals: Normal Fetal Ultrasounds or other Referrals:  None Maternal Substance Abuse:  No Significant Maternal Medications:  None Significant Maternal Lab Results: Group B Strep negative  No results found for this or any previous visit (from the past 24 hour(s)).  Patient Active Problem List   Diagnosis Date Noted   Encounter for elective induction of labor 04/20/2021   Unwanted fertility 03/01/2021   Hx of fetal anomaly in prior pregnancy, currently pregnant 11/03/2020   Rubella non-immune status, antepartum 10/08/2020   History of vacuum extraction assisted delivery 10/06/2020   Supervision of low-risk pregnancy 09/24/2020   Bipolar 2 disorder (HCC) 04/20/2016   Attention deficit hyperactivity disorder (ADHD) 04/20/2016    Assessment/Plan:  Allison Shaw is a 25 y.o. G2P1001 at [redacted]w[redacted]d here for eIOL.   #Labor: Internal os  about 0.5, will start with cytotec and attempt FB if able to on next check.  #Pain: PRN #FWB: Cat 1  #ID: GBS negative  #MOF: Breastfeeding  #MOC: BTL (signed 12/02/2020)  #Circ: Yes, inpatient   #LGA: EFW 97th percentile (7 +3) at [redacted]w[redacted]d [place]. Will prepare for dystocia. Pelvis tested to 3650g.   #History of Vacuum assisted vaginal delivery: Patient reports previous traumatic experience, provided supportive listening and reassurance.   #Iron deficiency anemia: Hgb 9.2 on admit. IV venofer likely postpartum.  Derrel Nip, MD  04/20/2021, 11:24 AM   GME ATTESTATION:  I saw and evaluated the patient. I agree with the findings and the plan of care as documented in the resident's note.  Leticia Penna, DO OB Fellow, Faculty [facility], Center for [facility] 04/20/2021 4:19 PM

## 2021-04-21 ENCOUNTER — Inpatient Hospital Stay (HOSPITAL_COMMUNITY): Payer: Medicaid Other | Admitting: Anesthesiology

## 2021-04-21 ENCOUNTER — Encounter (HOSPITAL_COMMUNITY): Payer: Self-pay | Admitting: Family Medicine

## 2021-04-21 DIAGNOSIS — O3663X0 Maternal care for excessive fetal growth, third trimester, not applicable or unspecified: Secondary | ICD-10-CM

## 2021-04-21 DIAGNOSIS — Z3A39 39 weeks gestation of pregnancy: Secondary | ICD-10-CM

## 2021-04-21 LAB — RPR: RPR Ser Ql: NONREACTIVE

## 2021-04-21 MED ORDER — PHENYLEPHRINE 40 MCG/ML (10ML) SYRINGE FOR IV PUSH (FOR BLOOD PRESSURE SUPPORT): 80.0000 ug | PREFILLED_SYRINGE | INTRAVENOUS | Status: DC | PRN

## 2021-04-21 MED ORDER — EPHEDRINE 5 MG/ML INJ: 10.0000 mg | INTRAVENOUS | Status: DC | PRN

## 2021-04-21 MED ORDER — LACTATED RINGERS IV SOLN
500.0000 mL | Freq: Once | INTRAVENOUS | Status: AC
  Administered 2021-04-21: 500 mL via INTRAVENOUS

## 2021-04-21 MED ORDER — DIPHENHYDRAMINE HCL 50 MG/ML IJ SOLN
12.5000 mg | INTRAMUSCULAR | Status: DC | PRN
  Administered 2021-04-21 (×2): 12.5 mg via INTRAVENOUS
  Filled 2021-04-21 (×2): qty 1

## 2021-04-21 MED ORDER — BUTALBITAL-APAP-CAFFEINE 50-325-40 MG PO TABS: 1.0000 | ORAL_TABLET | Freq: Four times a day (QID) | ORAL | Status: DC | PRN

## 2021-04-21 MED ORDER — FENTANYL-BUPIVACAINE-NACL 0.5-0.125-0.9 MG/250ML-% EP SOLN
12.0000 mL/h | EPIDURAL | Status: DC | PRN
  Administered 2021-04-21: 1.8 mL/h via EPIDURAL
  Filled 2021-04-21: qty 250

## 2021-04-21 MED ORDER — TERBUTALINE SULFATE 1 MG/ML IJ SOLN
0.2500 mg | Freq: Once | INTRAMUSCULAR | Status: DC | PRN
Start: 2021-04-21 — End: 2021-04-22

## 2021-04-21 MED ORDER — EPHEDRINE 5 MG/ML INJ
10.0000 mg | INTRAVENOUS | Status: DC | PRN
Start: 2021-04-21 — End: 2021-04-22

## 2021-04-21 MED ORDER — PHENYLEPHRINE 40 MCG/ML (10ML) SYRINGE FOR IV PUSH (FOR BLOOD PRESSURE SUPPORT)
80.0000 ug | PREFILLED_SYRINGE | INTRAVENOUS | Status: DC | PRN
  Filled 2021-04-21: qty 10

## 2021-04-21 MED ORDER — LIDOCAINE HCL (PF) 1 % IJ SOLN
INTRAMUSCULAR | Status: DC | PRN
  Administered 2021-04-21: 2 mL

## 2021-04-21 MED ORDER — TRANEXAMIC ACID-NACL 1000-0.7 MG/100ML-% IV SOLN
INTRAVENOUS | Status: AC
  Administered 2021-04-21: 1000 mg via INTRAVENOUS
  Filled 2021-04-21: qty 100

## 2021-04-21 MED ORDER — TRANEXAMIC ACID-NACL 1000-0.7 MG/100ML-% IV SOLN: 1000.0000 mg | Freq: Once | INTRAVENOUS | Status: AC

## 2021-04-21 MED ORDER — OXYTOCIN-SODIUM CHLORIDE 30-0.9 UT/500ML-% IV SOLN
1.0000 m[IU]/min | INTRAVENOUS | Status: DC
  Administered 2021-04-21: 2 m[IU]/min via INTRAVENOUS

## 2021-04-21 NOTE — Anesthesia Procedure Notes (Signed)
Epidural Patient location during procedure: OB Start time: 04/21/2021 2:22 AM End time: 04/21/2021 2:32 AM  Staffing Anesthesiologist: Mal Amabile, MD Performed: anesthesiologist   Preanesthetic Checklist Completed: patient identified, IV checked, site marked, risks and benefits discussed, surgical consent, monitors and equipment checked, pre-op evaluation and timeout performed  Epidural Patient position: sitting Prep: DuraPrep and site prepped and draped Patient monitoring: continuous pulse ox and blood pressure Approach: midline Location: L3-L4 Injection technique: LOR air  Needle:  Needle type: Tuohy  Needle gauge: 17 G Needle length: 9 cm and 9 Needle insertion depth: 5 cm cm Catheter type: closed end flexible Catheter size: 19 Gauge Catheter at skin depth: 10 cm Test dose: negative and Other  Assessment Events: blood not aspirated, injection not painful, no injection resistance, no paresthesia and negative IV test  Additional Notes Patient identified. Risks and benefits discussed including failed block, incomplete  Pain control, post dural puncture headache, nerve damage, paralysis, blood pressure Changes, nausea, vomiting, reactions to medications-both toxic and allergic and post Partum back pain. All questions were answered. Patient expressed understanding and wished to proceed. Sterile technique was used throughout procedure. Epidural site was Dressed with sterile barrier dressing. No paresthesias, signs of intravascular injection  + CSF return, intrathecal catheter placed.  Patient was more comfortable after the intrathecal catheter was dosed. Please see RN's note for documentation of vital signs and FHR which are stable. Patient informed of "wet tap" and increased possibility of PDPHA. Will leave intrathecal catheter in place for 24 hours.  Infusion adjusted for intrathecal catheter. RN also informed of above events. Reason for block:procedure for pain

## 2021-04-21 NOTE — Anesthesia Preprocedure Evaluation (Signed)
Anesthesia Evaluation  Patient identified by MRN, date of birth, ID band Patient awake    Reviewed: Allergy & Precautions, Patient's Chart, lab work & pertinent test results  Airway Mallampati: II  TM Distance: >3 FB Neck ROM: Full    Dental no notable dental hx. (+) Teeth Intact   Pulmonary former smoker,    Pulmonary exam normal breath sounds clear to auscultation       Cardiovascular negative cardio ROS Normal cardiovascular exam Rhythm:Regular Rate:Normal     Neuro/Psych PSYCHIATRIC DISORDERS Depression Bipolar Disorder negative neurological ROS     GI/Hepatic negative GI ROS, Neg liver ROS,   Endo/Other  Obesity  Renal/GU negative Renal ROS  negative genitourinary   Musculoskeletal   Abdominal (+) + obese,   Peds  Hematology  (+) anemia ,   Anesthesia Other Findings   Reproductive/Obstetrics                             Anesthesia Physical Anesthesia Plan  ASA: 2  Anesthesia Plan: Epidural   Post-op Pain Management:    Induction:   PONV Risk Score and Plan:   Airway Management Planned: Natural Airway  Additional Equipment:   Intra-op Plan:   Post-operative Plan:   Informed Consent: I have reviewed the patients History and Physical, chart, labs and discussed the procedure including the risks, benefits and alternatives for the proposed anesthesia with the patient or authorized representative who has indicated his/her understanding and acceptance.       Plan Discussed with: Anesthesiologist  Anesthesia Plan Comments:         Anesthesia Quick Evaluation

## 2021-04-21 NOTE — Progress Notes (Signed)
Labor Progress Note Allison Shaw is a 25 y.o. G2P1001 at [redacted]w[redacted]d who presented for elective IOL.   S: Patient is doing well overall. Requesting epidural.  O:  BP 125/69   Pulse 77   Temp 98.1 F (36.7 C) (Oral)   Resp 18   Ht 5\' 3"  (1.6 m)   Wt 77.1 kg   LMP 05/24/2020   BMI 30.11 kg/m  EFM: 125 bpm, moderate variability, + accels, no decels   CVE: Dilation: 4 Effacement (%): 70 Cervical Position: Anterior Station: -2 Presentation: Vertex Exam by:: [email or web address], RN   A&P: 25 y.o. G2P1001 [redacted]w[redacted]d #Labor: Progressing well. Will plan to start Pitocin after epidural placement. Will confirm presentation prior to additional augmentation.  #Pain: Epidural pending #FWB: Cat 1  #GBS negative  [redacted]w[redacted]d, MD 2:38 AM

## 2021-04-21 NOTE — Progress Notes (Signed)
Patient contracting well on her own at this time post-epidural. Will continue expectant management for now and consider Pitocin as needed.   Evalina Field, MD

## 2021-04-21 NOTE — Discharge Summary (Signed)
Postpartum Discharge Summary    Patient Name: Allison Shaw DOB: 1996/01/19 MRN: [id number]  Date of admission: 04/20/2021 Delivery date:04/21/2021  Delivering provider: Genia Del  Date of discharge: 04/23/2021  Admitting diagnosis: Encounter for elective induction of labor [Z34.90] Intrauterine pregnancy: [redacted]w[redacted]d    Secondary diagnosis:  Principal Problem:   Vaginal delivery Active Problems:   [place] 2 disorder ([place]   History of vacuum extraction assisted delivery   Rubella non-immune status, antepartum   Hx of fetal anomaly in prior pregnancy, currently pregnant   Unwanted fertility  Additional problems: urinary retention    Discharge diagnosis: Term Pregnancy Delivered                                              Post partum procedures: changed mind about BTL Augmentation: Pitocin and Cytotec Complications: None  Hospital course: Induction of Labor With Vaginal Delivery   25y.o. yo G2P1001 at 374w2das admitted to the hospital 04/20/2021 for induction of labor.  Indication for induction: Elective.  Patient had an uncomplicated labor course. She received Cytotec and Pitocin followed by SROM until she progressed to complete. Additional delivery details below: Membrane Rupture Time/Date: 7:15 PM ,04/21/2021   Delivery Method:Vaginal, Spontaneous  Episiotomy: None  Lacerations:  1st degree;Perineal  Details of delivery can be found in separate delivery note.  Patient had a routine postpartum course. She is eating, drinking, ambulating, and voiding without issue. Patient is discharged home 04/23/21.  Newborn Data: Birth date:04/21/2021  Birth time:10:56 PM  Gender:Female  Living status:Living  Apgars:9 ,9  Weight:3929 g   Magnesium Sulfate received: No BMZ received: No Rhophylac:N/A MMR:Yes - needs postpartum  T-DaP:Given prenatally Flu: N/A Transfusion:No  Physical exam  Vitals:   04/22/21 0740 04/22/21 1137 04/22/21 1520 04/22/21 2151  BP: 111/69 118/74  115/69 110/62  Pulse: 97 88 90 83  Resp:  '18 17 13  ' Temp: 98.2 F (36.8 C) (!) 97.5 F (36.4 C) 98.3 F (36.8 C) 98.4 F (36.9 C)  TempSrc: Oral Oral Oral Oral  SpO2: 99% 100%  99%  Weight:      Height:       General: alert, cooperative, and no distress Lochia: appropriate Uterine Fundus: firm Incision: N/A DVT Evaluation: No evidence of DVT seen on physical exam. Negative Homan's sign. No cords or calf tenderness. No significant calf/ankle edema.  Labs: Lab Results  Component Value Date   WBC 6.8 04/20/2021   HGB 9.2 (L) 04/20/2021   HCT 31.1 (L) 04/20/2021   MCV 82.9 04/20/2021   PLT 182 04/20/2021   CMP Latest Ref Rng & Units 01/29/2021  Glucose 65 - 99 mg/dL 178(H)  BUN 6 - 20 mg/dL 6  Creatinine 0.57 - 1.00 mg/dL 0.49(L)  Sodium 134 - 144 mmol/L 137  Potassium 3.5 - 5.2 mmol/L 3.6  Chloride 96 - 106 mmol/L 105  CO2 20 - 29 mmol/L 15(L)  Calcium 8.7 - 10.2 mg/dL 8.6(L)  Total Protein 6.0 - 8.5 g/dL 5.7(L)  Total Bilirubin 0.0 - 1.2 mg/dL <0.2  Alkaline Phos 44 - 121 IU/L 62  AST 0 - 40 IU/L 10  ALT 0 - 32 IU/L 8   Edinburgh Score: Edinburgh Postnatal Depression Scale Screening Tool 04/22/2021  I have been able to laugh and see the funny side of things. 0  I have looked forward with  enjoyment to things. 0  I have blamed myself unnecessarily when things went wrong. 0  I have been anxious or worried for no good reason. 0  I have felt scared or panicky for no good reason. 0  Things have been getting on top of me. 0  I have been so unhappy that I have had difficulty sleeping. 0  I have felt sad or miserable. 0  I have been so unhappy that I have been crying. 0  The thought of harming myself has occurred to me. 0  Edinburgh Postnatal Depression Scale Total 0     After visit meds:  Allergies as of 04/23/2021       Reactions   Latex Itching, Other (See Comments)   Reaction:  Burning    Fish Allergy Rash        Medication List     STOP taking these  medications    calcium carbonate 500 MG chewable tablet Commonly known as: TUMS - dosed in mg elemental calcium   cyclobenzaprine 5 MG tablet Commonly known as: FLEXERIL   docusate sodium 100 MG capsule Commonly known as: Colace   famotidine 10 MG tablet Commonly known as: PEPCID   metoCLOPramide 10 MG tablet Commonly known as: REGLAN   pantoprazole 40 MG tablet Commonly known as: Protonix   promethazine 25 MG tablet Commonly known as: PHENERGAN       TAKE these medications    ibuprofen 600 MG tablet Commonly known as: ADVIL Take 1 tablet (600 mg total) by mouth every 6 (six) hours.   iron polysaccharides 150 MG capsule Commonly known as: NIFEREX Take 1 capsule (150 mg total) by mouth daily.   PRENATAL VITAMINS PO Take 1 tablet by mouth daily.         Discharge home in stable condition Infant Feeding: Breast Infant Disposition:home with mother Discharge instruction: per After Visit Summary and Postpartum booklet. Activity: Advance as tolerated. Pelvic rest for 6 weeks.  Diet: routine diet Future Appointments: Future Appointments  Date Time Provider [place]  05/20/2021  2:15 PM Starr Lake, CNM [facility]   Follow up Visit:  [place] [facility] [place] [facility] Follow up on 05/20/2021.   Specialty: Obstetrics and Gynecology Why: for postpartum appointment Contact information: [place] [id number] [phone]               Message sent by Dr. Gwenlyn Perking to [facility] on 04/21/21.   Please schedule this patient for a In person postpartum visit in 4 weeks with the following provider: Any provider. Additional Postpartum F/U: None   Low risk pregnancy complicated by:  N/A Delivery mode:  Vaginal, Spontaneous  Anticipated Birth Control: maybe IUD   04/23/2021 Christin Fudge, CNM

## 2021-04-21 NOTE — Progress Notes (Signed)
Labor Progress Note Allison Shaw is a 25 y.o. G2P1001 at [redacted]w[redacted]d who presented for elective IOL.   S: Patient is doing well; no concerns at this time.   O:  BP 108/63   Pulse 90   Temp 98.2 F (36.8 C) (Axillary)   Resp 14   Ht 5\' 3"  (1.6 m)   Wt 77.1 kg   LMP 05/24/2020   SpO2 98%   BMI 30.11 kg/m  EFM: 125 bpm, moderate variability, + accels, no decels   CVE: Dilation: 5 Effacement (%): 70 Cervical Position: Anterior Station: -2 Presentation: Vertex Exam by:: [email or web address], RN   A&P: 25 y.o. G2P1001 [redacted]w[redacted]d  #Labor: Patient has progressed since last check, though contractions now spacing somewhat. Will start Pitocin 2x2. Will reassess in 3-4 hours.  #Pain: Epidural #FWB: Cat 1  #GBS negative  [redacted]w[redacted]d, MD 6:06 AM

## 2021-04-21 NOTE — Progress Notes (Signed)
Labor Progress Note Allison Shaw is a 25 y.o. G2P1001 at [redacted]w[redacted]d presented for elective IOL, LGA infant.   S: Doing well, trying to get some sleep.   O:  BP 116/68   Pulse 89   Temp 98.2 F (36.8 C) (Oral)   Resp 18   Ht 5\' 3"  (1.6 m)   Wt 77.1 kg   LMP 05/24/2020   SpO2 98%   BMI 30.11 kg/m  EFM: 140/mod/15x15/none   CVE: Dilation: 5 Effacement (%): 70 Cervical Position: Anterior Station: -3 Presentation: Vertex Exam by:: Dr [email or web address]   A&P: 25 y.o. G2P1001 [redacted]w[redacted]d. #Labor: Minimal progression since last check, internal os may be closer to about 4-4.5 and still very far back/ballotable. Will continue to titrate pit 2x2 with position changes as needed. Hopeful for AROM at some point today as cervical exam is more favorable.  #Pain: Epidural in place.  #FWB: Cat 1  #GBS negative   [redacted]w[redacted]d, DO 11:26 AM

## 2021-04-21 NOTE — Progress Notes (Signed)
Patient requesting primary C-section.  Went to bedside to discuss plan.  Reviewed her and her husband's concerns regarding prior urinary retention.  Reviewed prior delivery note and postpartum course.  Discussed with patient that while there is never a guarantee that these events will not recur it is very unlikely.  Also, reviewed based on current progression and prior delivery, there is not an medical indication for C-section at this time.  Pt was currently drowsy, advised to call with any further questions or concerns.  Myna Hidalgo, DO Attending Obstetrician & Gynecologist, [facility] for [employer], [facility]

## 2021-04-21 NOTE — Progress Notes (Signed)
Labor Progress Note Allison Shaw is a 25 y.o. G2P1001 at [redacted]w[redacted]d presented for IOL, LGA infant.   S: Feeling a lot more pressure and uncomfortable. Requesting C-section.   O:  BP (!) 107/48   Pulse (!) 112   Temp 98.1 F (36.7 C) (Oral)   Resp 18   Ht 5\' 3"  (1.6 m)   Wt 77.1 kg   LMP 05/24/2020   SpO2 98%   BMI 30.11 kg/m  EFM: 130/mod/15x15/occasional early/variable   CVE: Dilation: 9cm Effacement (%): 100 Cervical Position: Middle Station: -1 Presentation: Vertex Exam by:: Dr [email or web address]   A&P: 25 y.o. G2P1001 [redacted]w[redacted]d #Labor: Progressing well with pit currently at 20. Almost complete with bulging bag of water. Patient is quite uncomfortable with pressure, epidural in place. Patient declines any intervention (re-dosing/additional epidural pain medication, AROM) currently and would like to proceed with C-section. She is worried that this vaginal delivery will result in the same fashion as her first (traumatic history--VAVD with maternal exhaustion, bladder distension requiring I&O for several months) and also in pain. Discussed risks and benefits associated with both c-section and vaginal delivery. Provided reassurance that she almost complete. Then discussed with Dr. [redacted]w[redacted]d, she came to bedside to discuss with patient. Fortunately, she was then more comfortable after deciding to receive additional dose in epidural. After discussion, she is willing to proceed with VD currently. Reassuring maternal and fetus status.  #Pain: Epidural  #FWB: Cat 1  #GBS negative  Charlotta Newton, DO 7:08 PM

## 2021-04-21 NOTE — Plan of Care (Signed)
  Problem: Education: Goal: Knowledge of Childbirth will improve Outcome: Completed/Met Goal: Ability to make informed decisions regarding treatment and plan of care will improve Outcome: Completed/Met Goal: Ability to state and carry out methods to decrease the pain will improve Outcome: Completed/Met Goal: Individualized Educational Video(s) Outcome: Completed/Met

## 2021-04-22 ENCOUNTER — Inpatient Hospital Stay (HOSPITAL_COMMUNITY): Payer: Medicaid Other | Admitting: Anesthesiology

## 2021-04-22 ENCOUNTER — Encounter (HOSPITAL_COMMUNITY)
Admission: AD | Disposition: A | Discharge status: Home / Self Care | Payer: Self-pay | Source: Home / Self Care | Attending: Obstetrics & Gynecology

## 2021-04-22 SURGERY — LIGATION, FALLOPIAN TUBE, POSTPARTUM
Anesthesia: Choice | Laterality: Bilateral

## 2021-04-22 MED ORDER — WITCH HAZEL-GLYCERIN EX PADS: 1.0000 "application " | MEDICATED_PAD | CUTANEOUS | Status: DC | PRN

## 2021-04-22 MED ORDER — TETANUS-DIPHTH-ACELL PERTUSSIS 5-2.5-18.5 LF-MCG/0.5 IM SUSY: 0.5000 mL | PREFILLED_SYRINGE | Freq: Once | INTRAMUSCULAR | Status: DC

## 2021-04-22 MED ORDER — ONDANSETRON HCL 4 MG PO TABS: 4.0000 mg | ORAL_TABLET | ORAL | Status: DC | PRN

## 2021-04-22 MED ORDER — COCONUT OIL OIL: 1.0000 "application " | TOPICAL_OIL | Status: DC | PRN

## 2021-04-22 MED ORDER — MEDROXYPROGESTERONE ACETATE 150 MG/ML IM SUSP: 150.0000 mg | INTRAMUSCULAR | Status: DC | PRN

## 2021-04-22 MED ORDER — IBUPROFEN 600 MG PO TABS
600.0000 mg | ORAL_TABLET | Freq: Once | ORAL | Status: AC
  Administered 2021-04-22: 600 mg via ORAL
  Filled 2021-04-22: qty 1

## 2021-04-22 MED ORDER — DIPHENHYDRAMINE HCL 25 MG PO CAPS: 25.0000 mg | ORAL_CAPSULE | Freq: Four times a day (QID) | ORAL | Status: DC | PRN

## 2021-04-22 MED ORDER — FAMOTIDINE 20 MG PO TABS
40.0000 mg | ORAL_TABLET | Freq: Once | ORAL | Status: AC
  Administered 2021-04-22: 40 mg via ORAL
  Filled 2021-04-22: qty 2

## 2021-04-22 MED ORDER — METOCLOPRAMIDE HCL 10 MG PO TABS
10.0000 mg | ORAL_TABLET | Freq: Once | ORAL | Status: AC
  Administered 2021-04-22: 10 mg via ORAL
  Filled 2021-04-22: qty 1

## 2021-04-22 MED ORDER — BENZOCAINE-MENTHOL 20-0.5 % EX AERO: 1.0000 "application " | INHALATION_SPRAY | CUTANEOUS | Status: DC | PRN

## 2021-04-22 MED ORDER — MEASLES, MUMPS & RUBELLA VAC IJ SOLR
0.5000 mL | Freq: Once | INTRAMUSCULAR | Status: AC
  Administered 2021-04-23: 0.5 mL via SUBCUTANEOUS
  Filled 2021-04-22: qty 0.5

## 2021-04-22 MED ORDER — SIMETHICONE 80 MG PO CHEW: 80.0000 mg | CHEWABLE_TABLET | ORAL | Status: DC | PRN

## 2021-04-22 MED ORDER — ACETAMINOPHEN 325 MG PO TABS: 650.0000 mg | ORAL_TABLET | ORAL | Status: DC | PRN

## 2021-04-22 MED ORDER — ONDANSETRON HCL 4 MG/2ML IJ SOLN
4.0000 mg | INTRAMUSCULAR | Status: DC | PRN
  Administered 2021-04-22: 4 mg via INTRAVENOUS
  Filled 2021-04-22: qty 2

## 2021-04-22 MED ORDER — DIBUCAINE (PERIANAL) 1 % EX OINT: 1.0000 "application " | TOPICAL_OINTMENT | CUTANEOUS | Status: DC | PRN

## 2021-04-22 MED ORDER — SODIUM CHLORIDE 0.9 % IV SOLN
500.0000 mg | Freq: Once | INTRAVENOUS | Status: AC
  Administered 2021-04-22: 500 mg via INTRAVENOUS
  Filled 2021-04-22: qty 25

## 2021-04-22 MED ORDER — IBUPROFEN 600 MG PO TABS
600.0000 mg | ORAL_TABLET | Freq: Four times a day (QID) | ORAL | Status: DC
  Administered 2021-04-22 – 2021-04-23 (×6): 600 mg via ORAL
  Filled 2021-04-22 (×6): qty 1

## 2021-04-22 MED ORDER — LACTATED RINGERS IV SOLN: INTRAVENOUS | Status: DC

## 2021-04-22 MED ORDER — SENNOSIDES-DOCUSATE SODIUM 8.6-50 MG PO TABS
2.0000 | ORAL_TABLET | Freq: Every day | ORAL | Status: DC
  Administered 2021-04-22 – 2021-04-23 (×2): 2 via ORAL
  Filled 2021-04-22 (×2): qty 2

## 2021-04-22 MED ORDER — IBUPROFEN 600 MG PO TABS: 600.0000 mg | ORAL_TABLET | Freq: Once | ORAL | Status: DC

## 2021-04-22 NOTE — Clinical Social Work Maternal (Signed)
CLINICAL SOCIAL WORK MATERNAL/CHILD NOTE  Patient Details  Name: Allison Shaw MRN: [id number] Date of Birth: 10/15/1995  Date:  04/22/2021  Clinical Social Worker Initiating Note:  Tyshaun Vinzant, LCSWA Date/Time: Initiated:  04/22/21/1140     Child's Name:  Allison Shaw   Biological Parents:  Mother, Father (Allison Shaw 02/10/1996)   Need for Interpreter:  None   Reason for Referral:  Behavioral Health Concerns   Address:  [address] [place] [address]    Phone number:  [phone] (home)     Additional phone number:   Household Members/Support Persons (HM/SP):   Household Member/Support Person 1, Household Member/Support Person 2   HM/SP Name Relationship DOB or Age  HM/SP -1 Allison Shaw Spouse 02/10/1996  HM/SP -2 Allison Shaw Daughter 05/19/2016  HM/SP -3        HM/SP -4        HM/SP -5        HM/SP -6        HM/SP -7        HM/SP -8          Natural Supports (not living in the home):      Professional Supports:     Employment: Unemployed   Type of Work:     Education:  Some College   Homebound arranged:    Financial Resources:  Medicaid   Other Resources:   (Plan to apply for Food Stamps)   Cultural/Religious Considerations Which May Impact Care:    Strengths:  Ability to meet basic needs  , Pediatrician chosen, Home prepared for child     Psychotropic Medications:         Pediatrician:    [place] (including [place])  Pediatrician List:   [place]    [place]    [place]    [place]    [place]    [place] Other ([facility])    Pediatrician Fax Number:    Risk Factors/Current Problems:  None   Cognitive State:  Alert  , Insightful  , Linear Thinking     Mood/Affect:  Interested  , Calm  , Happy     CSW Assessment: CSW consulted for history of bipolar and depression. CSW met with [name] to assess and offer support. CSW observed [name] bedside performing skin to skin  with infant. [name] declined to have [name] leave the room for assessment. CSW informed [name] of reason for consult. [name] was understanding. [name] reported she was diagnosed with depression at age 13, ADHD in first grade, and unable to recall when she was diagnosed with bipolar. [name] stated her parents hid the diagnosis. [name] reported she did not experience any mental health symptoms during pregnancy and that she manages the diagnosis well. [name] shared she copes by showering, crying and cuddling with her children. [name] reported she has not been to therapy since 15 or been on medication in 7 years. [name] stated [name] and both of their families are supports. [name] denies any current SI or HI. [name] reported she is currently doing well and did not display any acute mental health symptoms.   CSW provided education regarding the baby blues period versus PPD and provided resources. CSW provided the New Mom Checklist and encouraged [name] to self evaluate.  CSW provided review of Sudden Infant Death Syndrome (SIDS) precautions. [name] has all essentials, including a packn'play. [name] denies barriers to infant follow-up care. [name] reported she has no additional needs at this time.     CSW identifies no further need for intervention and no barriers to discharge at this time.  CSW Plan/Description:  No Further Intervention Required/No Barriers to Discharge, Sudden Infant Death Syndrome (SIDS) Education, Perinatal Mood and Anxiety Disorder (PMADs) Education, Other Information/Referral to Community Resources    Donaciano Range J Tehillah Cipriani, LCSWA 04/22/2021, 11:56 AM  

## 2021-04-22 NOTE — Progress Notes (Signed)
    Faculty Practice OB/GYN Attending Postpartum Sterilization Counseling Note  25 y.o. G2P1001 s/p recent vaginal delivery 04/21/2021  at [redacted]w[redacted]d who desires permanent sterilization. Medicaid papers had been signed on 12/02/20.  Other reversible forms of contraception including the most effective LARCs such as IUD or Nexplanon were discussed with patient; she is considering IUD but reports having irregular bleeding with Depo Provera and Nexplanon. Also discussed possibility of vasectomy with her [name]. He was counseled about this procedure being less invasive and more effective than a postpartum tubal ligation, he is considering this option.  Given that patient seemed unsure of wanting to do this procedure now, she was also given the option of an interval laparoscopic tubal ligation or bilateral salpingectomy which slightly increases the efficacy and is less invasive.  At this point, she was still unsure of what she wanted to do.   Details of postpartum tubal sterilization discussed in detail.   She was told that this will be performed as either salpingectomy or occlusion with Filshie clips, depending on difficulty of procedure, exposure and other factors.  Risks of procedure discussed with patient including but not limited to: risk of regret (emphasized that this is increased especially for patients under the age of 13), permanence of method, bleeding, infection, injury to surrounding organs and need for additional procedures.  Failure risk of about 1-2% with increased risk of ectopic gestation if pregnancy occurs was also discussed with patient.   Also discussed possibility of post-tubal syndrome with increased pelvic pain or menstrual irregularities. Patient verbalized understanding of these risks and does not want to proceed with sterilization today.  She will do more research about alternatives proposed, or let us know if she wants interval procedure.  OB OR team and Anesthesiology team notified of patient's  desire to cancel the procedure for today.  Patient's nursing team also notified.  Routine postpartum orders resumed.  Will continue routine postpartum care.    Jaynie Collins, MD, FACOG Obstetrician & Gynecologist, [facility] for [employer], [facility]

## 2021-04-22 NOTE — Progress Notes (Signed)
Patient 3/U on fundal assessment, foley in place, bladder scanned for . Bleeding normal, pain controlled, does not feel any bladder pressure.   Dr. Mathis Fare called for update on patient status. BTL scheduled for later. Dr. Mathis Fare has no regarding concerns and no new orders.

## 2021-04-22 NOTE — Progress Notes (Signed)
Intrathecal catheter removed, tip in tact. 20ml saline given through catheter prior to removal to hopefully abate PDPH. D/w pt the type of headache that would concern [place] for PDPH and discussed conservative management (PO hydration, caffeine, tylenol, motrin) if she develops said headache after discharge tomorrow. Also discussed returning to MAU for blood patch w/ patient if conservative measures fail.   Anesthesia service will see patient this evening and tomorrow morning to re-evaluate. Please call with questions.

## 2021-04-22 NOTE — Lactation Note (Signed)
This note was copied from a baby's chart. Lactation Consultation Note  Patient Name: Allison Shaw [id number] Date: 04/22/2021 Reason for consult: L&D Initial assessment;Term Age:25 hours LC entered the room,mom was doing skin to skin and infant was cuing to breastfeed. Infant would not latch at first, LC did suck training and mom hand expressed 5 mls of colostrum that  was spoon fed to infant. Mom latched infant on her right breast using the football hold position, infant sustained latch, swallows heard and infant breastfeed for 15 minutes. Afterwards mom did skin to skin with infant as LC left the room. Mom knows to breastfeed infant according to primal cues: licking, kissing, tasting smacking and hands and fist in mouth, breastfeed infant skin to skin. Mom knows to call RN/LC on MBU if she need further assistance with latching infant at the breast. Maternal Data Has patient been taught Hand Expression?: Yes Does the patient have breastfeeding experience prior to this delivery?: Yes How long did the patient breastfeed?: Per mom, she only breastfeed her 47 year old daughter for one year.  Feeding Mother's Current Feeding Choice: Breast Milk  LATCH Score Latch: Grasps breast easily, tongue down, lips flanged, rhythmical sucking.  Audible Swallowing: Spontaneous and intermittent  Type of Nipple: Everted at rest and after stimulation  Comfort (Breast/Nipple): Soft / non-tender  Hold (Positioning): Assistance needed to correctly position infant at breast and maintain latch.  LATCH Score: 9   Lactation Tools Discussed/Used    Interventions Interventions: Breast feeding basics reviewed;Assisted with latch;Breast compression;Adjust position;Skin to skin;Support pillows;Breast massage;Position options;Hand express;Expressed milk;Education  Discharge Pump: Personal WIC Program: No  Consult Status Consult Status: Follow-up from L&D    Danelle Earthly 04/22/2021, 12:28 AM

## 2021-04-22 NOTE — Anesthesia Postprocedure Evaluation (Signed)
Anesthesia Post Note  Patient: Allison Shaw  Procedure(s) Performed: AN AD HOC LABOR EPIDURAL     Patient location during evaluation: Mother Baby Anesthesia Type: Epidural Level of consciousness: oriented and awake and alert Pain management: pain level controlled Vital Signs Assessment: post-procedure vital signs reviewed and stable Respiratory status: spontaneous breathing and respiratory function stable Cardiovascular status: blood pressure returned to baseline and stable Postop Assessment: no backache, no apparent nausea or vomiting and able to ambulate Anesthetic complications: yes Comments: Intrathecal catheter removed without issue. Currently c/o non-positional headache from fasting. Will continue to monitor over next 24h.   No notable events documented.  Last Vitals:  Vitals:   04/22/21 0740 04/22/21 1137  BP: 111/69 118/74  Pulse: 97 88  Resp:  18  Temp: 36.8 C (!) 36.4 C  SpO2: 99% 100%    Last Pain:  Vitals:   04/22/21 1137  TempSrc: Oral  PainSc:    Pain Goal:                   Lannie Fields

## 2021-04-22 NOTE — Anesthesia Preprocedure Evaluation (Signed)
Anesthesia Evaluation    Reviewed: Allergy & Precautions, Patient's Chart, lab work & pertinent test results  Airway        Dental   Pulmonary former smoker,           Cardiovascular negative cardio ROS       Neuro/Psych PSYCHIATRIC DISORDERS Depression Bipolar Disorder negative neurological ROS     GI/Hepatic negative GI ROS, Neg liver ROS,   Endo/Other  negative endocrine ROS  Renal/GU negative Renal ROS  negative genitourinary   Musculoskeletal negative musculoskeletal ROS (+)   Abdominal   Peds  Hematology  (+) Blood dyscrasia, anemia , hct 31.1, plt 182   Anesthesia Other Findings   Reproductive/Obstetrics Desires fertility                            Anesthesia Physical Anesthesia Plan  ASA: 2  Anesthesia Plan: Spinal   Post-op Pain Management:    Induction:   PONV Risk Score and Plan: 2 and Treatment may vary due to age or medical condition, Ondansetron, Dexamethasone and Midazolam  Airway Management Planned: Natural Airway  Additional Equipment: None  Intra-op Plan:   Post-operative Plan:   Informed Consent:   Plan Discussed with:   Anesthesia Plan Comments: (Has intrathecal catheter- will use for PPTL)        Anesthesia Quick Evaluation

## 2021-04-22 NOTE — Anesthesia Postprocedure Evaluation (Signed)
Anesthesia Post Note  Patient: Allison Shaw  Procedure(s) Performed: AN AD HOC LABOR EPIDURAL     Patient location during evaluation: Mother Baby Anesthesia Type: Epidural Level of consciousness: awake and alert Pain management: pain level controlled Vital Signs Assessment: post-procedure vital signs reviewed and stable Respiratory status: spontaneous breathing, nonlabored ventilation and respiratory function stable Cardiovascular status: stable Postop Assessment: no headache, no backache and epidural receding Anesthetic complications: no   No notable events documented.  Last Vitals:  Vitals:   04/22/21 0211 04/22/21 0316  BP: 131/76 (!) 111/58  Pulse: (!) 106 (!) 101  Resp: 18 18  Temp: 36.9 C 37.2 C  SpO2: 100% 100%    Last Pain:  Vitals:   04/22/21 0316  TempSrc: Oral  PainSc: 0-No pain   Pain Goal:                   Marquise Wicke

## 2021-04-22 NOTE — Progress Notes (Signed)
Post Partum Day 1 Subjective: Patient is doing well this morning. She denies any pain at this time. Bleeding is similar to a period. She has her foley catheter in place. She is NPO for her postpartum tubal ligation later today. She is breastfeeding well. No other concerns at this time.  Objective: Blood pressure (!) 111/58, pulse (!) 101, temperature 98.9 F (37.2 C), temperature source Oral, resp. rate 18, height 5\' 3"  (1.6 m), weight 77.1 kg, last menstrual period 05/24/2020, SpO2 100 %.  Physical Exam:  General: alert, cooperative, and no distress Lochia: appropriate Uterine Fundus: firm DVT Evaluation: no LE edema or calf tenderness to palpation  Recent Labs    04/20/21 1125  HGB 9.2*  HCT 31.1*    Assessment/Plan: Allison Shaw is a 25 year old G2P2002 on PPD#1 s/p SVD.  Progressing well. VSS. Pain and bleeding controlled.   Feeding: Breast; doing well Contraception: NPO for PP BTL Circumcision: Desires  #Hx of urinary retention: - Patient had urinary retention after previous delivery requiring I/O cath - Will maintain foley catheter for now  - Consider removal later today if doing well  - Plan for strict I/O post-foley removal   Dispo: Plan for discharge PPD#2   LOS: 2 days   22, MD [facility] Fellow  Faculty Practice 04/22/2021, 6:26 AM

## 2021-04-22 NOTE — Lactation Note (Signed)
This note was copied from a baby's chart. Lactation Consultation Note  Patient Name: Allison Shaw Date: 04/22/2021 Reason for consult: Initial assessment Age:25 hours  P2, Latched in laid back position with frequent swallows. Reviewed hand expressed drops. Feed on demand with cues.  Goal 8-12+ times per day after first 24 hrs.  Place baby STS if not cueing.  Mom made aware of O/P services, breastfeeding support groups, community resources, and our phone # for post-discharge questions.    Maternal Data Has patient been taught Hand Expression?: Yes Does the patient have breastfeeding experience prior to this delivery?: Yes How long did the patient breastfeed?: 1 month (family emergency caused mother to stop bf)  Feeding Mother's Current Feeding Choice: Breast Milk  LATCH Score Latch: Grasps breast easily, tongue down, lips flanged, rhythmical sucking. (latched upon entering and unlatched)  Audible Swallowing: Spontaneous and intermittent  Type of Nipple: Everted at rest and after stimulation  Comfort (Breast/Nipple): Soft / non-tender  Hold (Positioning): Assistance needed to correctly position infant at breast and maintain latch.  LATCH Score: 9    Interventions Interventions: Breast feeding basics reviewed;Hand express;Education  Discharge    Consult Status Consult Status: Follow-up Date: 04/23/21 Follow-up type: In-patient    Dahlia Byes [facility] 04/22/2021, 8:28 AM

## 2021-04-23 ENCOUNTER — Encounter (HOSPITAL_COMMUNITY): Payer: Self-pay | Admitting: *Deleted

## 2021-04-23 MED ORDER — IBUPROFEN 600 MG PO TABS: 600.0000 mg | ORAL_TABLET | Freq: Four times a day (QID) | ORAL | 0 refills | Status: DC

## 2021-04-23 NOTE — Lactation Note (Signed)
This note was copied from a baby's chart. Lactation Consultation Note  Patient Name: Allison Shaw [id number] Date: 04/23/2021   Age:25 hours  Mom's milk has not come to volume, but Mom reports having had feelings of breast fullness this morning with subsequent softening with feeding. Mom has 7 oz of EBM at home from pumping antenatally.   Infant was crying when I entered room. Mom reported that he was gassy & wouldn't latch despite her attempts. I asked for permission and assisted with latch. Infant latched easily with swallows audible to the naked ear. Infant did need mandible lowered initially for maternal comfort.   Mom's nipples are intact, but reports that R nipple was cracked & bleeding yesterday. When infant released the R breast after feeding, the nipple was rounded with no signs of trauma. Mom says she has not noticed the crack today.   Mom has a hx of an abundant supply with her 1st child. Mom reports that she could pump 16 oz/session with her last child.   Mom feels that she can latch infant on her own now that specifics of an asymmetric latch were shown via [employer] and discussion. Mom knows if infant has difficulties with latch once at home, she can use the stored EBM to help feed infant.   Dr. Sarita Haver was given an update to pass along to Dr. Margo Aye.  Lurline Hare [facility] 04/23/2021, 12:48 PM

## 2021-05-04 ENCOUNTER — Telehealth (HOSPITAL_COMMUNITY): Payer: Self-pay | Admitting: *Deleted

## 2021-05-04 NOTE — Telephone Encounter (Signed)
Left message to return nurse call.  Duffy Rhody, RN 05-04-2021 at 3:19pm

## 2021-05-06 ENCOUNTER — Encounter: Payer: Self-pay | Admitting: General Practice

## 2021-05-12 ENCOUNTER — Ambulatory Visit (INDEPENDENT_AMBULATORY_CARE_PROVIDER_SITE_OTHER): Payer: Medicaid Other

## 2021-05-12 ENCOUNTER — Encounter (HOSPITAL_COMMUNITY): Payer: Self-pay

## 2021-05-12 ENCOUNTER — Other Ambulatory Visit: Payer: Self-pay

## 2021-05-12 ENCOUNTER — Ambulatory Visit (HOSPITAL_COMMUNITY)
Admission: EM | Admit: 2021-05-12 | Discharge: 2021-05-12 | Disposition: A | Discharge status: Home / Self Care | Payer: Medicaid Other | Attending: Family Medicine | Admitting: Family Medicine

## 2021-05-12 DIAGNOSIS — R509 Fever, unspecified: Secondary | ICD-10-CM

## 2021-05-12 DIAGNOSIS — R059 Cough, unspecified: Secondary | ICD-10-CM | POA: Diagnosis not present

## 2021-05-12 LAB — POCT URINALYSIS DIPSTICK, ED / UC
Bilirubin Urine: NEGATIVE
Glucose, UA: NEGATIVE mg/dL
Ketones, ur: 40 mg/dL — AB
Nitrite: NEGATIVE
Protein, ur: 100 mg/dL — AB
Specific Gravity, Urine: 1.02 (ref 1.005–1.030)
Urobilinogen, UA: 1 mg/dL (ref 0.0–1.0)
pH: 5.5 (ref 5.0–8.0)

## 2021-05-12 LAB — CBG MONITORING, ED: Glucose-Capillary: 79 mg/dL (ref 70–99)

## 2021-05-12 MED ORDER — CEPHALEXIN 500 MG PO CAPS: 500.0000 mg | ORAL_CAPSULE | Freq: Two times a day (BID) | ORAL | 0 refills | Status: DC

## 2021-05-12 MED ORDER — ACETAMINOPHEN 325 MG PO TABS
650.0000 mg | ORAL_TABLET | Freq: Once | ORAL | Status: AC
  Administered 2021-05-12: 650 mg via ORAL

## 2021-05-12 MED ORDER — ACETAMINOPHEN 325 MG PO TABS
ORAL_TABLET | ORAL | Status: AC
  Filled 2021-05-12: qty 2

## 2021-05-12 NOTE — ED Triage Notes (Addendum)
Pt c/o cough, bilateral ear pain/pressure, fever, fatigue, and increase thirst x2wks. Last tylenol at noon. States she is breast feeding and has a clog duct to rt breast.

## 2021-05-13 NOTE — ED Provider Notes (Signed)
[facility]   [id number] 05/12/21 Arrival Time: 1808  ASSESSMENT & PLAN:  1. Fever and chills    I have personally viewed the imaging studies ordered this visit. Normal CXR.  Labs Reviewed  POCT URINALYSIS DIPSTICK, ED / UC - Abnormal; Notable for the following components:      Result Value   Ketones, ur 40 (*)    Hgb urine dipstick TRACE (*)    Protein, ur 100 (*)    Leukocytes,Ua SMALL (*)    All other components within normal limits  URINE CULTURE  CBG MONITORING, ED   Will tx empirically for pyelonephritis. Urine culture sent. Begin: Meds ordered this encounter  Medications   acetaminophen (TYLENOL) tablet 650 mg   cephALEXin (KEFLEX) 500 MG capsule    Sig: Take 1 capsule (500 mg total) by mouth 2 (two) times daily.    Dispense:  14 capsule    Refill:  0     Follow-up Information     [facility] at [facility].   Specialty: Urgent Care Why: If worsening or failing to improve as anticipated. Contact information: [address] [place] [address] [phone]                Reviewed expectations re: course of current medical issues. Questions answered. Outlined signs and symptoms indicating need for more acute intervention. Understanding verbalized. After Visit Summary given.   SUBJECTIVE: History from: patient. Allison Shaw is a 25 y.o. female who reports fatigue, increased thrist, occas cough and flank discomfort. Past week at least. Tylenol with some help. Denies: runny nose, congestion, and difficulty breathing. Normal PO intake without n/v/d.  OBJECTIVE:  Vitals:   05/12/21 1846  BP: 112/82  Pulse: (!) 143  Resp: 18  Temp: (!) 103 F (39.4 C)  TempSrc: Oral  SpO2: 96%  Temp noted Recheck P: 122  General appearance: alert; no distress Eyes: PERRLA; EOMI; conjunctiva normal HENT: [place]; AT; without nasal congestion Neck: supple  Lungs: speaks full sentences without difficulty; unlabored Abd:  benign Extremities: no edema Skin: warm and dry Neurologic: normal gait Psychological: alert and cooperative; normal mood and affect  Labs: Results for orders placed or performed during the hospital encounter of 05/12/21  POC CBG monitoring  Result Value Ref Range   Glucose-Capillary 79 70 - 99 mg/dL  POC Urinalysis dipstick  Result Value Ref Range   Glucose, UA NEGATIVE NEGATIVE mg/dL   Bilirubin Urine NEGATIVE NEGATIVE   Ketones, ur 40 (A) NEGATIVE mg/dL   Specific Gravity, Urine 1.020 1.005 - 1.030   Hgb urine dipstick TRACE (A) NEGATIVE   pH 5.5 5.0 - 8.0   Protein, ur 100 (A) NEGATIVE mg/dL   Urobilinogen, UA 1.0 0.0 - 1.0 mg/dL   Nitrite NEGATIVE NEGATIVE   Leukocytes,Ua SMALL (A) NEGATIVE   Labs Reviewed  POCT URINALYSIS DIPSTICK, ED / UC - Abnormal; Notable for the following components:      Result Value   Ketones, ur 40 (*)    Hgb urine dipstick TRACE (*)    Protein, ur 100 (*)    Leukocytes,Ua SMALL (*)    All other components within normal limits  URINE CULTURE  CBG MONITORING, ED    Imaging: DG Chest 2 View  Result Date: 05/12/2021 CLINICAL DATA:  Fever, cough, tobacco abuse EXAM: CHEST - 2 VIEW COMPARISON:  None. FINDINGS: The heart size and mediastinal contours are within normal limits. Both lungs are clear. The visualized skeletal structures are unremarkable.  IMPRESSION: No active cardiopulmonary disease. Electronically Signed   By: Sharlet Salina M.D.   On: 05/12/2021 19:29    Allergies  Allergen Reactions   Latex Itching and Other (See Comments)    Reaction:  Burning    Fish Allergy Rash    Past Medical History:  Diagnosis Date   ADHD (attention deficit hyperactivity disorder)    Anemia    Anorexia    Bipolar 1 disorder (HCC)    Depression    Mental disorder    Urinary retention    had to I&Ocath at home after delivery   Social History   Socioeconomic History   Marital status: Married    Spouse name: Not on file   Number of children:  Not on file   Years of education: Not on file   Highest education level: Not on file  Occupational History   Not on file  Tobacco Use   Smoking status: Former    Types: Cigarettes   Smokeless tobacco: Never   Tobacco comments:    stopped with pregnancy; around others who smoke  Vaping Use   Vaping Use: Former   Quit date: 02/22/2020   Substances: Nicotine  Substance and Sexual Activity   Alcohol use: Not Currently    Comment: occasionally; stopped when found out pregnant- drank alot on New Years    Drug use: No   Sexual activity: Yes    Comment: states missed a few days of the pill because not good pills ; stopped taking until end of October or early November  Other Topics Concern   Not on file  Social History Narrative   Not on file   Social Determinants of Health   Financial Resource Strain: Not on file  Food Insecurity: No Food Insecurity   Worried About Programme researcher, broadcasting/film/video in the Last Year: Never true   Barista in the Last Year: Never true  Transportation Needs: No Transportation Needs   Lack of Transportation (Medical): No   Lack of Transportation (Non-Medical): No  Physical Activity: Not on file  Stress: Not on file  Social Connections: Not on file  Intimate Partner Violence: Not At Risk   Fear of Current or Ex-Partner: No   Emotionally Abused: No   Physically Abused: No   Sexually Abused: No   Family History  Problem Relation Age of Onset   Mental illness Mother    Depression Mother    Bipolar disorder Mother    Diabetes Mother    ADD / ADHD Father    Past Surgical History:  Procedure Laterality Date   EYE SURGERY Bilateral    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

## 2021-05-14 LAB — URINE CULTURE: Culture: >=100000 — AB

## 2021-05-20 ENCOUNTER — Other Ambulatory Visit: Payer: Self-pay

## 2021-05-20 ENCOUNTER — Encounter: Payer: Self-pay | Admitting: Student

## 2021-05-20 ENCOUNTER — Ambulatory Visit (INDEPENDENT_AMBULATORY_CARE_PROVIDER_SITE_OTHER): Payer: Medicaid Other | Admitting: Student

## 2021-05-20 NOTE — Progress Notes (Signed)
Post Partum Visit Note  Allison Shaw is a 25 y.o. G66P1001 female who presents for a postpartum visit. She is 4 weeks postpartum following a normal spontaneous vaginal delivery.  I have fully reviewed the prenatal and intrapartum course. The delivery was at 39.2 gestational weeks.  Anesthesia: epidural. Postpartum course has been uneventful. Baby is doing well. Baby is feeding by breast. Bleeding no bleeding. Bowel function is normal. Bladder function is normal. Patient is not sexually active. Contraception method is IUD. Postpartum depression screening: negative.   The pregnancy intention screening data noted above was reviewed. Potential methods of contraception were discussed. The patient elected to proceed with No data recorded.   Edinburgh Postnatal Depression Scale - 05/20/21 1431       Edinburgh Postnatal Depression Scale:  In the Past 7 Days   I have been able to laugh and see the funny side of things. 0    I have looked forward with enjoyment to things. 0    I have blamed myself unnecessarily when things went wrong. 0    I have been anxious or worried for no good reason. 0    I have felt scared or panicky for no good reason. 0    Things have been getting on top of me. 0    I have been so unhappy that I have had difficulty sleeping. 0    I have felt sad or miserable. 0    I have been so unhappy that I have been crying. 0    The thought of harming myself has occurred to me. 0    Edinburgh Postnatal Depression Scale Total 0             Health Maintenance Due  Topic Date Due   COVID-19 Vaccine (1) Never done   HPV VACCINES (1 - 2-dose series) Never done    The following portions of the patient's history were reviewed and updated as appropriate: allergies, current medications, past family history, past medical history, past social history, past surgical history, and problem list.  Review of Systems Pertinent items are noted in HPI.  Objective:  BP 124/72   Pulse  86   Wt 142 lb (64.4 kg)   LMP  (LMP Unknown)   Breastfeeding Yes   BMI 25.15 kg/m    General:  alert, cooperative, and no distress   Breasts:  Not done  Lungs: clear to auscultation bilaterally  Heart:  regular rate and rhythm, S1, S2 normal, no murmur, click, rub or gallop  Abdomen: soft, non-tender; bowel sounds normal; no masses,  no organomegaly   Wound none  GU exam:  normal       Assessment:    There are no diagnoses linked to this encounter.  Healthy postpartum exam.   Plan:   Essential components of care per ACOG recommendations:  1.  Mood and well being: Patient with negative depression screening today. Reviewed local resources for support.  - Patient tobacco use? No.   - hx of drug use? No.    2. Infant care and feeding:  -Patient currently breastmilk feeding?  -Social determinants of health (SDOH) reviewed in EPIC. No concerns  3. Sexuality, contraception and birth spacing - Patient does not want a pregnancy in the next year.  Desired family size is 2 children.  - Reviewed forms of contraception in tiered fashion. Patient desired IUD today.  See procedure note - Discussed birth spacing of 18 months  4. Sleep and fatigue -Encouraged family/partner/community  support of 4 hrs of uninterrupted sleep to help with mood and fatigue  5. Physical Recovery  - Discussed patients delivery and complications. She describes her labor as good. - Patient had a Vaginal, no problems at delivery. Patient had a 1st degree laceration. Perineal healing reviewed. Patient expressed understanding - Patient has urinary incontinence? Yes - Patient is safe to resume physical and sexual activity 7 days after her IUD has been in place -Patient will come back for string check in 4 weeks 6.  Health Maintenance - HM due items addressed Yes - Last pap smear  Diagnosis  Date Value Ref Range Status  10/06/2020   Final   - Negative for intraepithelial lesion or malignancy (NILM)   Pap  smear not done at today's visit.  -Breast Cancer screening indicated? No.   7. Chronic Disease/Pregnancy Condition follow up: None  - PCP follow up  Marylene Land, CNM [facility], [facility]      GYNECOLOGY CLINIC PROCEDURE NOTE  Allison Shaw is a 25 y.o. G2P1001 here for [place] IUD insertion. No GYN concerns.  Last pap smear was on 10/06/2020 and was normal.  IUD Insertion Procedure Note Patient identified, informed consent performed, consent signed.   Discussed risks of irregular bleeding, cramping, infection, malpositioning or misplacement of the IUD outside the uterus which may require further procedure such as laparoscopy. Time out was performed.  Urine pregnancy test negative.  Speculum placed in the vagina.  Cervix visualized.  Cleaned with Betadine x 2.  Grasped anteriorly with a single tooth tenaculum.  Uterus sounded to 7cm.   IUD placed per manufacturer's recommendations.  Strings trimmed to 3 cm. Tenaculum was removed, good hemostasis noted.  Patient tolerated procedure well.   Patient was given post-procedure instructions.  She was advised to have backup contraception for one week.  Patient was also asked to check IUD strings periodically and follow up in 4 weeks for IUD check.

## 2021-05-22 ENCOUNTER — Encounter: Payer: Self-pay | Admitting: Radiology

## 2021-05-27 ENCOUNTER — Other Ambulatory Visit: Payer: Self-pay

## 2021-05-27 ENCOUNTER — Encounter (HOSPITAL_COMMUNITY): Payer: Self-pay | Admitting: Emergency Medicine

## 2021-05-27 ENCOUNTER — Ambulatory Visit (HOSPITAL_COMMUNITY)
Admission: EM | Admit: 2021-05-27 | Discharge: 2021-05-27 | Disposition: A | Discharge status: Home / Self Care | Payer: Medicaid Other | Attending: Emergency Medicine | Admitting: Emergency Medicine

## 2021-05-27 DIAGNOSIS — R519 Headache, unspecified: Secondary | ICD-10-CM | POA: Diagnosis not present

## 2021-05-27 DIAGNOSIS — B349 Viral infection, unspecified: Secondary | ICD-10-CM | POA: Diagnosis not present

## 2021-05-27 LAB — POCT URINALYSIS DIPSTICK, ED / UC
Bilirubin Urine: NEGATIVE
Glucose, UA: NEGATIVE mg/dL
Ketones, ur: NEGATIVE mg/dL
Nitrite: NEGATIVE
Protein, ur: 100 mg/dL — AB
Specific Gravity, Urine: 1.02 (ref 1.005–1.030)
Urobilinogen, UA: 0.2 mg/dL (ref 0.0–1.0)
pH: 5.5 (ref 5.0–8.0)

## 2021-05-27 MED ORDER — KETOROLAC TROMETHAMINE 30 MG/ML IJ SOLN
30.0000 mg | Freq: Once | INTRAMUSCULAR | Status: AC
  Administered 2021-05-27: 30 mg via INTRAMUSCULAR

## 2021-05-27 MED ORDER — DEXAMETHASONE SODIUM PHOSPHATE 10 MG/ML IJ SOLN
INTRAMUSCULAR | Status: AC
  Filled 2021-05-27: qty 1

## 2021-05-27 MED ORDER — KETOROLAC TROMETHAMINE 30 MG/ML IJ SOLN
INTRAMUSCULAR | Status: AC
  Filled 2021-05-27: qty 1

## 2021-05-27 MED ORDER — DEXAMETHASONE SODIUM PHOSPHATE 10 MG/ML IJ SOLN
10.0000 mg | Freq: Once | INTRAMUSCULAR | Status: AC
  Administered 2021-05-27: 10 mg via INTRAMUSCULAR

## 2021-05-27 NOTE — ED Triage Notes (Signed)
Pt is present today with bilateral ear pain, HA, fever, and cough. Pt states sx started x5 weeks ago.

## 2021-05-27 NOTE — Discharge Instructions (Addendum)
Due to the length of your symptoms it is possible that they are being caused by seasonal allergies however your fever makes it suspicious for viral illness, we will attempt to treat for both  Please confirm use of over-the-counter medications with your gynecologist for treatment of symptoms specifically the Excedrin that relieves you of your headache symptoms, if you are unable to take this medicine please continue use of Tylenol  Your urinalysis today showed a moderate amount of Allison Shaw blood cells, this could possibly be due to you.,  Your urine has been sent to the lab to see if it grows bacteria, if this occurs medication will be sent in for you  You may follow-up with urgent care as needed  If this is related to a viral illness it should improve with time typically over the next 7 to 10 days, if it is related to allergies it will improve once the weather stabilizes  It is safe for you to take an antihistamine such as Zyrtec or Claritin daily to help with allergy symptoms

## 2021-05-29 ENCOUNTER — Telehealth (HOSPITAL_COMMUNITY): Payer: Self-pay | Admitting: Emergency Medicine

## 2021-05-30 LAB — URINE CULTURE: Culture: >=100000 — AB

## 2021-05-31 ENCOUNTER — Telehealth (HOSPITAL_COMMUNITY): Payer: Self-pay | Admitting: Emergency Medicine

## 2021-05-31 MED ORDER — CEPHALEXIN 500 MG PO CAPS: 500.0000 mg | ORAL_CAPSULE | Freq: Three times a day (TID) | ORAL | 0 refills | Status: AC

## 2021-06-03 NOTE — ED Provider Notes (Signed)
[facility]    CSN: [id number] Arrival date & time: 05/27/21  0076      History   Chief Complaint Chief Complaint  Patient presents with   Fever   Cough   Headache    HPI Allison Shaw is a 25 y.o. female.   Patient presenting with bilateral ear pain, intermittent generalized headaches and intermittent cough present for 5 weeks.  Began having a fever 1 to 2 days ago.  Endorses urinary frequency but unsure if related to recent pregnancy, vaginal birth 5 weeks ago, menses started 1 day ago.  Attempted use of Tylenol with no relief.  Currently breast-feeding.  History of ADHD, anemia, bipolar 1 disorder, depression.  Denies sore throat, shortness of breath, wheezing, discharge, itching, odor, new rash or lesions, urinary urgency, abdominal pain, flank pain.  Fever Associated symptoms: cough and headaches   Associated symptoms: no chills and no dysuria   Cough Associated symptoms: fever and headaches   Associated symptoms: no chills, no diaphoresis, no shortness of breath and no wheezing   Headache Associated symptoms: cough and fever   Associated symptoms: no dizziness, no fatigue, no numbness, no seizures and no weakness   Fever Associated symptoms: cough and headaches   Associated symptoms: no chills   Cough Associated symptoms: fever and headaches   Associated symptoms: no chills, no diaphoresis, no shortness of breath and no wheezing   Headache Associated symptoms: cough and fever   Associated symptoms: no dizziness, no fatigue, no numbness, no seizures and no weakness   Fever Associated symptoms: cough and headaches   Associated symptoms: no chills   Cough Associated symptoms: fever and headaches   Associated symptoms: no chills, no diaphoresis, no shortness of breath and no wheezing   Headache Associated symptoms: cough and fever   Associated symptoms: no dizziness, no fatigue, no numbness and no seizures   Fever Associated symptoms: cough and  headaches   Associated symptoms: no chills   Cough Associated symptoms: fever and headaches   Associated symptoms: no chills, no diaphoresis, no shortness of breath and no wheezing   Headache Associated symptoms: cough and fever   Associated symptoms: no dizziness and no fatigue   Fever Associated symptoms: cough and headaches   Associated symptoms: no chills   Cough Associated symptoms: fever and headaches   Associated symptoms: no chills, no diaphoresis, no shortness of breath and no wheezing   Headache Associated symptoms: cough and fever   Associated symptoms: no fatigue   Fever Associated symptoms: cough and headaches   Associated symptoms: no chills   Cough Associated symptoms: fever and headaches   Associated symptoms: no chills and no diaphoresis   Headache Associated symptoms: cough and fever   Associated symptoms: no fatigue   Fever Associated symptoms: congestion, cough and headaches   Associated symptoms: no chills   Cough Associated symptoms: fever and headaches   Associated symptoms: no chills and no diaphoresis   Headache Associated symptoms: congestion, cough and fever   Associated symptoms: no fatigue   Fever Associated symptoms: cough and headaches   Associated symptoms: no chills and no congestion   Cough Associated symptoms: fever and headaches   Associated symptoms: no chills and no diaphoresis   Headache Associated symptoms: cough and fever   Associated symptoms: no congestion and no fatigue   Fever Associated symptoms: cough and headaches   Associated symptoms: no chills   Cough Associated symptoms: fever and headaches   Associated symptoms: no chills and no  diaphoresis   Headache Associated symptoms: cough and fever   Associated symptoms: no fatigue   Fever Associated symptoms: cough and headaches   Associated symptoms: no chills   Cough Associated symptoms: fever and headaches   Associated symptoms: no chills and no diaphoresis    Headache Associated symptoms: cough and fever    Past Medical History:  Diagnosis Date   ADHD (attention deficit hyperactivity disorder)    Anemia    Anorexia    Bipolar 1 disorder (HCC)    Depression    Mental disorder    Urinary retention    had to I&Ocath at home after delivery    Patient Active Problem List   Diagnosis Date Noted   Vaginal delivery 04/20/2021   Unwanted fertility 03/01/2021   Hx of fetal anomaly in prior pregnancy, currently pregnant 11/03/2020   Rubella non-immune status, antepartum 10/08/2020   History of vacuum extraction assisted delivery 10/06/2020   Supervision of low-risk pregnancy 09/24/2020   Bipolar 2 disorder (HCC) 04/20/2016   Attention deficit hyperactivity disorder (ADHD) 04/20/2016    Past Surgical History:  Procedure Laterality Date   EYE SURGERY Bilateral    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 Authorizing Provider  cephALEXin (KEFLEX) 500 MG capsule Take 1 capsule (500 mg total) by mouth 3 (three) times daily for 5 days. 05/31/21 06/05/21  Merrilee Jansky, MD  ibuprofen (ADVIL) 600 MG tablet Take 1 tablet (600 mg total) by mouth every 6 (six) hours. 04/23/21   Cresenzo-Dishmon, Scarlette Calico, CNM  iron polysaccharides (NIFEREX) 150 MG capsule Take 1 capsule (150 mg total) by mouth daily. Patient not taking: Reported on 05/20/2021 04/09/21   Bernerd Limbo, CNM    Family History Family History  Problem Relation Age of Onset   Mental illness Mother    Depression Mother    Bipolar disorder Mother    Diabetes Mother    ADD / ADHD Father     Social History Social History   Tobacco Use   Smoking status: Former    Types: Cigarettes   Smokeless tobacco: Never    Tobacco comments:    stopped with pregnancy; around others who smoke  Vaping Use   Vaping Use: Former   Quit date: 02/22/2020   Substances: Nicotine  Substance Use Topics   Alcohol use: Not Currently    Comment: occasionally; stopped when found out pregnant- drank alot on New Years    Drug use: No     Allergies   Latex and Fish allergy   Review of Systems Review of Systems  Constitutional:  Positive for fever. Negative for activity change, appetite change, chills, diaphoresis, fatigue and unexpected weight change.  HENT: Negative.    Respiratory:  Positive for cough. Negative for apnea, choking, chest tightness, shortness of breath, wheezing and stridor.   Cardiovascular: Negative.   Gastrointestinal: Negative.   Genitourinary:  Positive for frequency. Negative for decreased urine volume, difficulty  urinating, dyspareunia, dysuria, enuresis, flank pain, genital sores, hematuria, menstrual problem, pelvic pain, urgency, vaginal discharge and vaginal pain.  Skin: Negative.   Neurological:  Positive for headaches. Negative for dizziness, tremors, seizures, syncope, facial asymmetry, speech difficulty, weakness, light-headedness and numbness.    Physical Exam Triage Vital Signs ED Triage Vitals  Enc Vitals Group     BP 05/27/21 1939 122/82     Pulse Rate 05/27/21 1939 (!) 117     Resp 05/27/21 1939 18     Temp 05/27/21 1939 99.3 F (37.4 C)     Temp src --      SpO2 05/27/21 1939 100 %     Weight --      Height --      Head Circumference --      Peak Flow --      Pain Score 05/27/21 1937 0     Pain Loc --      Pain Edu? --      Excl. in GC? --    No data found.  Updated Vital Signs BP 122/82   Pulse (!) 117   Temp 99.3 F (37.4 C)   Resp 18   LMP  (LMP Unknown)   SpO2 100%   Breastfeeding Yes   Visual Acuity Right Eye Distance:   Left Eye Distance:   Bilateral Distance:    Right Eye Near:   Left Eye Near:    Bilateral Near:     Physical  Exam Constitutional:      Appearance: Normal appearance. She is normal weight.  HENT:     Head: Normocephalic.     Right Ear: Tympanic membrane, ear canal and external ear normal.     Left Ear: Ear canal and external ear normal.     Nose: Nose normal.     Mouth/Throat:     Mouth: Mucous membranes are moist.     Pharynx: Oropharynx is clear.  Eyes:     Extraocular Movements: Extraocular movements intact.  Cardiovascular:     Rate and Rhythm: Normal rate and regular rhythm.     Pulses: Normal pulses.     Heart sounds: Normal heart sounds.  Pulmonary:     Effort: Pulmonary effort is normal.     Breath sounds: Normal breath sounds.  Abdominal:     General: Abdomen is flat. Bowel sounds are normal.     Palpations: Abdomen is soft.  Musculoskeletal:        General: Normal range of motion.     Cervical back: Normal range of motion and neck supple.  Skin:    General: Skin is warm and dry.  Neurological:     Mental Status: She is alert and oriented to person, place, and time. Mental status is at baseline.  Psychiatric:        Mood and Affect: Mood normal.        Behavior: Behavior normal.     UC Treatments / Results  Labs (all labs ordered are listed, but only abnormal results are displayed) Labs Reviewed  URINE CULTURE - Abnormal; Notable for the following components:      Result Value   Culture >=100,000 COLONIES/mL ESCHERICHIA COLI (*)    Organism ID, Bacteria ESCHERICHIA COLI (*)    All other components within normal limits  POCT URINALYSIS DIPSTICK, ED / UC - Abnormal; Notable for the following components:   Hgb urine dipstick LARGE (*)    Protein, ur 100 (*)    Leukocytes,Ua MODERATE (*)  All other components within normal limits    EKG   Radiology No results found.  Procedures Procedures (including critical care time)  Medications Ordered in UC Medications  ketorolac (TORADOL) 30 MG/ML injection 30 mg (30 mg Intramuscular Given 05/27/21 2025)   dexamethasone (DECADRON) injection 10 mg (10 mg Intramuscular Given 05/27/21 2025)    Initial Impression / Assessment and Plan / UC Course  I have reviewed the triage vital signs and the nursing notes.  Pertinent labs & imaging results that were available during my care of the patient were reviewed by me and considered in my medical decision making (see chart for details).  Bad headache Viral illness  Seasonal allergies virus viral etiology for symptoms, urinary tract infection most recent pregnancy, discussed possible etiologies of symptoms with patient, urinalysis showing Darlen Gledhill blood cells, sent for culture, patient to consult with obstetricians about medication due to breastfeeding, given Toradol and Decadron injection while in office to help manage symptoms. Recommended patient to start second generation antihistamine to manage allergies. Follow up with urgent care as needed    Final Clinical Impressions(s) / UC Diagnoses   Final diagnoses:  Bad headache  Viral illness     Discharge Instructions      Due to the length of your symptoms it is possible that they are being caused by seasonal allergies however your fever makes it suspicious for viral illness, we will attempt to treat for both  Please confirm use of over-the-counter medications with your gynecologist for treatment of symptoms specifically the Excedrin that relieves you of your headache symptoms, if you are unable to take this medicine please continue use of Tylenol  Your urinalysis today showed a moderate amount of Schawn Byas blood cells, this could possibly be due to you.,  Your urine has been sent to the lab to see if it grows bacteria, if this occurs medication will be sent in for you  You may follow-up with urgent care as needed  If this is related to a viral illness it should improve with time typically over the next 7 to 10 days, if it is related to allergies it will improve once the weather stabilizes  It is safe  for you to take an antihistamine such as Zyrtec or Claritin daily to help with allergy symptoms   ED Prescriptions   None    PDMP not reviewed this encounter.   Valinda Hoar, NP 06/03/21 [phone]

## 2021-06-16 ENCOUNTER — Ambulatory Visit (INDEPENDENT_AMBULATORY_CARE_PROVIDER_SITE_OTHER): Payer: Medicaid Other | Admitting: Student

## 2021-06-16 ENCOUNTER — Other Ambulatory Visit: Payer: Self-pay

## 2021-06-16 VITALS — BP 124/74 | HR 97 | Wt 126.9 lb

## 2021-06-16 DIAGNOSIS — R339 Retention of urine, unspecified: Secondary | ICD-10-CM | POA: Diagnosis not present

## 2021-06-16 DIAGNOSIS — Z30431 Encounter for routine checking of intrauterine contraceptive device: Secondary | ICD-10-CM | POA: Diagnosis not present

## 2021-06-17 ENCOUNTER — Telehealth: Payer: Self-pay | Admitting: Student

## 2021-06-17 ENCOUNTER — Other Ambulatory Visit: Payer: Self-pay | Admitting: Student

## 2021-06-17 DIAGNOSIS — R300 Dysuria: Secondary | ICD-10-CM

## 2021-06-17 NOTE — Progress Notes (Unsigned)
am

## 2021-06-17 NOTE — Progress Notes (Signed)
  History:  [name] is a 25 y.o. G2P1001 who presents to clinic today for follow up for IUD string check. She also reports that she feels some pressure like she has a UTI. Last time she had this feeling she had urinary retention and had to do self-catheter after being seen in the [facility]. SHe says she is able to void now but she feels like she is not completely emptying. She denies fever, chills, low back pain.   The following portions of the patient's history were reviewed and updated as appropriate: allergies, current medications, family history, past medical history, social history, past surgical history and problem list.  Review of Systems:  Review of Systems  Constitutional: Negative.   HENT: Negative.    Respiratory: Negative.    Cardiovascular: Negative.   Genitourinary: Negative.   Musculoskeletal: Negative.   Skin: Negative.   Neurological: Negative.   Psychiatric/Behavioral: Negative.       Objective:  Physical Exam BP 124/74   Pulse 97   Wt 126 lb 14.4 oz (57.6 kg)   LMP  (LMP Unknown)   BMI 22.48 kg/m  Physical Exam Abdominal:     General: Abdomen is flat.  Genitourinary:    General: Normal vulva.     Comments: NEFG; no discharge in the vagina, IUD strings visualized x 2 Musculoskeletal:        General: Normal range of motion.  Skin:    General: Skin is warm.  Neurological:     General: No focal deficit present.     Mental Status: She is alert.  Psychiatric:        Mood and Affect: Mood normal.      Labs and Imaging No results found for this or any previous visit (from the past 24 hour(s)).  No results found.  Health Maintenance Due  Topic Date Due   COVID-19 Vaccine (1) Never done   HPV VACCINES (1 - 2-dose series) Never done     Assessment & Plan:  1. Urine retention -patient left urine sample, will send for culture. Viewed that patient had a UTI in October; has finished her keflex -encouraged patient to come to ED if she feels she  cannot void, if she develops any worsening symptoms. We will follow up on urine culture.  -patient was not seen until 4:56 and urogyn not available; will contact Urogyn tomorrow and find out if Foley Catheter placement now or waiting for appt is appropriate. Patient knows that I will call her with a plan, otherwise, she will see Dr. Alysia Penna on 11/14.  - Urine Culture  Approximately 30 minutes of total time was spent with this patient on counseling and care.   Marylene Land, CNM 06/17/2021 11:16 AM

## 2021-06-17 NOTE — Telephone Encounter (Signed)
Called Urogyn and was able to work in patient tomorrow afternoon; message sent to Dr. Tildon Husky to confirm. Patient notified that it is important to keep appt; otherwise, it will be until December before she can be seen by specialist. Patient verbalized understanding and states that she is going to work out transportation.   Allison Shaw

## 2021-06-18 ENCOUNTER — Other Ambulatory Visit: Payer: Self-pay

## 2021-06-18 ENCOUNTER — Encounter: Payer: Self-pay | Admitting: Obstetrics and Gynecology

## 2021-06-18 ENCOUNTER — Ambulatory Visit (INDEPENDENT_AMBULATORY_CARE_PROVIDER_SITE_OTHER): Payer: Medicaid Other | Admitting: Obstetrics and Gynecology

## 2021-06-18 VITALS — BP 121/74 | HR 85 | Ht 63.0 in | Wt 126.0 lb

## 2021-06-18 DIAGNOSIS — N3000 Acute cystitis without hematuria: Secondary | ICD-10-CM

## 2021-06-18 DIAGNOSIS — R3989 Other symptoms and signs involving the genitourinary system: Secondary | ICD-10-CM | POA: Diagnosis not present

## 2021-06-18 DIAGNOSIS — R350 Frequency of micturition: Secondary | ICD-10-CM

## 2021-06-18 LAB — POCT URINALYSIS DIPSTICK
Appearance: ABNORMAL
Bilirubin, UA: NEGATIVE
Glucose, UA: NEGATIVE
Ketones, UA: NEGATIVE
Nitrite, UA: NEGATIVE
Protein, UA: NEGATIVE
Spec Grav, UA: 1.02 (ref 1.010–1.025)
Urobilinogen, UA: 0.2 E.U./dL
pH, UA: 5.5 (ref 5.0–8.0)

## 2021-06-18 MED ORDER — SULFAMETHOXAZOLE-TRIMETHOPRIM 800-160 MG PO TABS: 1.0000 | ORAL_TABLET | Freq: Two times a day (BID) | ORAL | 0 refills | Status: AC

## 2021-06-18 NOTE — Progress Notes (Signed)
[facility] New Patient Evaluation and Consultation  Referring Provider: Jorje Guild* PCP: Patient, No Pcp Per (Inactive) Date of Service: 06/18/2021  SUBJECTIVE Chief Complaint: New Patient (Initial Visit)  History of Present Illness: Allison Shaw is a 25 y.o. White or Caucasian female seen in consultation at the request of CNM Luna Kitchens for evaluation of incontinence and incomplete bladder emptying.    Review of records from [facility] significant for: 8 weeks postpartum. Having pressure like she has a UTI- culture sent and pending. Has a history of urinary retention after delivery and feels she is not emptying her bladder well.   Urine cultures:   05/27/21: >100,000 E.Coli 05/12/21: > 100,000 E.Coli - treated with keflex both times  Urinary Symptoms: Do you leak urine? Yes  When do you leak urine? cough/sneeze   laughing   full bladder  How many times do you leak in one day?  Before my son it was every time i laughed really hard or sneezed a couple times. Currently its when ever it decides to  For leakage protection, do you use pads  If using leakage protection, how many do you use in one day? 2  Are you bothered by your leakage? Yes  How many times do you urinate in the daytime? Every few hours- sometimes not able to go  How many times do you wake up at night to urinate? 2  When you urinate, does it feel like you empty your bladder completely? No  Do you use a catheter to help empty your bladder? No  Do you notice any of the following when you urinate? (select all that appy) weak stream   difficulty starting urine stream   push on belly or vagina to help empty bladder  Number of urinary tract infections in the last year: 2    Last delivery, had to learn to self-cath for 6 months (husband catheterized her) because she was unable to void well.  Currently, has to push on her belly to urinate in order to empty. Other times leaks and  cannot make it to the bathroom.  Does not feel that UTI symptoms resolved since her last positive urine culture in October.   Pelvic Organ Prolapse Symptoms:                  She Denies a feeling of a bulge the vaginal area.   Bowel Symptom: Bowel movements: 1 time(s) per day Stool consistency: soft  Straining: no.  Splinting: no.  Incomplete evacuation: no.  She Denies accidental bowel leakage / fecal incontinence Bowel regimen: none  Sexual Function Sexually active: yes.  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 Developed chorioamniontis; went home with foley  1 Gravida             Vaginal deliveries: 2,  Forceps/ Vacuum deliveries: 0, Cesarean section: 0 S/p vaginal delivery on 04/21/2021   Medications: She has a current medication list which includes the following prescription(s): ibuprofen, sulfamethoxazole-trimethoprim, and iron polysaccharides.   Allergies: Patient is allergic to latex and fish allergy.   Social History:  Social History   Tobacco Use   Smoking status: Former    Types: Cigarettes   Smokeless tobacco: Never   Tobacco comments:    stopped with pregnancy; around others who smoke  Vaping Use   Vaping  Use: Former   Quit date: 02/22/2020  Substance Use Topics   Alcohol use: Not Currently    Comment: occasionally; stopped when found out pregnant- drank alot on New Years    Drug use: No    What is your current relationship status? Married  Who do you live with? My husband and 2 kids  Are you currently working? No  Do you exercise regularly? No  Have you ever been emotionally, physically or sexually abused? No  Do you feel safe in your current relationship? Yes    Family History:   Family History  Problem Relation Age of Onset   Mental illness Mother    Depression Mother    Bipolar disorder Mother    Diabetes Mother    ADD / ADHD Father      Review of Systems: Review of Systems  Constitutional:  Negative for fever, malaise/fatigue and weight loss.  Respiratory:  Negative for cough, shortness of breath and wheezing.   Cardiovascular:  Negative for chest pain, palpitations and leg swelling.  Gastrointestinal:  Negative for abdominal pain and blood in stool.  Genitourinary:  Negative for dysuria.  Musculoskeletal:  Negative for myalgias.  Skin:  Negative for rash.  Neurological:  Negative for dizziness and headaches.  Endo/Heme/Allergies:  Does not bruise/bleed easily.  Psychiatric/Behavioral:  Negative for depression. The patient is not nervous/anxious.     OBJECTIVE Physical Exam: Vitals:   06/18/21 1551  BP: 121/74  Pulse: 85  Weight: 126 lb (57.2 kg)  Height: 5\' 3"  (1.6 m)    Physical Exam Constitutional:      General: She is not in acute distress. Pulmonary:     Effort: Pulmonary effort is normal.  Abdominal:     General: There is no distension.     Palpations: Abdomen is soft.     Tenderness: There is no abdominal tenderness. There is no rebound.  Musculoskeletal:        General: No swelling. Normal range of motion.  Skin:    General: Skin is warm and dry.     Findings: No rash.  Neurological:     Mental Status: She is alert and oriented to person,  place, and time.  Psychiatric:        Mood and Affect: Mood normal.        Behavior: Behavior normal.     GU / Detailed Urogynecologic Evaluation:  Pelvic Exam: Normal external female genitalia; Bartholin's and Skene's glands normal in appearance; urethral meatus normal in appearance, no urethral masses or discharge.   CST: negative  Speculum exam reveals normal vaginal mucosa without atrophy. Cervix normal  appearance. Uterus normal single, nontender. Adnexa no mass, fullness, tenderness.    s/p hysterectomy: Speculum exam reveals normal vag   Pelvic floor musculature: Right levator non-tender, Right obturator non-tender, Left levator non-tender, Left obturator non-tender  POP-Q:   POP-Q  -2                                            Aa   -2                                           Ba  -8                                              C   4                                            Gh  3                                            Pb  10.5                                            tvl   -2.5                                            Ap  -2.5                                            Bp  -9.5                                              D     Rectal Exam:  Normal external rectum  Post-Void Residual (PVR) by Bladder Scan: In order to evaluate bladder emptying, we discussed obtaining a postvoid residual and she agreed to this procedure.  Procedure: The ultrasound unit was placed on the patient's abdomen in the suprapubic region after the patient had voided. A PVR of 43 ml was obtained by bladder scan.  Laboratory Results: POC urine: Large leukocytes, neg nitrites   ASSESSMENT AND PLAN Ms. Shaw is a 25 y.o. with:  1. Acute cystitis without hematuria   2. Urinary frequency   3. Sensation of pressure in bladder area    - Suspect that she still has a urinary tract infection that has not resolved. Culture sent by CNM 2 days ago and is still pending. Will  treat empirically  with bactrim DS BID x7 days.  - If culture confirms infection, will consider daily prophylactic antibiotic  - Self- cath teaching done today. She will keep a log of voided volumes and catheterize after every void over the weekend. She will send [place] the logs in My Chart. Emptied well today but states she had to strain so this will give a clearer picture if she has incomplete emptying.   Jaquita Folds, MD   Medical Decision Making:  - Reviewed/ ordered a clinical laboratory test - Review and summation of prior records

## 2021-06-22 ENCOUNTER — Telehealth: Payer: Self-pay

## 2021-06-22 NOTE — Telephone Encounter (Signed)
Attempt made to contact Allison Shaw is a 25 y.o. female re: self-cathing log over the weekend.  There was no answer.  LM on the VM for the patient to call back or upload the log on mychart.

## 2021-06-22 NOTE — Telephone Encounter (Signed)
-----   Message from Marguerita Beards, MD sent at 06/21/2021  3:19 PM EST ----- Regarding: self cath volumes? Can you contact this patient and see if she has done the self-catheterizing over the weekend?

## 2021-06-24 ENCOUNTER — Encounter: Payer: Self-pay | Admitting: Student

## 2021-06-24 ENCOUNTER — Telehealth: Payer: Self-pay | Admitting: Student

## 2021-06-24 ENCOUNTER — Encounter: Payer: Self-pay | Admitting: Obstetrics and Gynecology

## 2021-06-24 DIAGNOSIS — N390 Urinary tract infection, site not specified: Secondary | ICD-10-CM | POA: Insufficient documentation

## 2021-06-24 LAB — URINE CULTURE

## 2021-06-24 NOTE — Telephone Encounter (Signed)
Called patient to make sure she was taking her abx, based on her UTI results. She states that she has been taking her antibiotics  and is feeling better.  She also states that she has only been able to straight-cath occasionally. I suggested that she reach out to Dr. Peterson [facility] staff to update them, as I can tell from the notes that they are trying to reach her. She says that she will do so.   Allison Shaw

## 2021-06-28 ENCOUNTER — Ambulatory Visit: Payer: Medicaid Other | Admitting: Obstetrics and Gynecology

## 2021-06-28 ENCOUNTER — Other Ambulatory Visit: Payer: Self-pay

## 2021-07-22 NOTE — Progress Notes (Deleted)
[facility] Return Visit  SUBJECTIVE  History of Present Illness: Allison Shaw is a 25 y.o. female seen in follow-up for recurrent UTI and incomplete bladder emptying. Has had two positive urinary tract infections in the last two months. Was sent home to self catheterize for PVR and was found to have residuals of 50-100. This was in normal range, so instructed to stop catheterizing.     Past Medical History: Patient  has a past medical history of ADHD (attention deficit hyperactivity disorder), Anemia, Anorexia, Bipolar 1 disorder (HCC), Depression, Mental disorder, and Urinary retention.   Past Surgical History: She  has a past surgical history that includes Eye surgery (Bilateral).   Medications: She has a current medication list which includes the following prescription(s): ibuprofen and iron polysaccharides.   Allergies: Patient is allergic to latex and fish allergy.   Social History: Patient  reports that she has quit smoking. Her smoking use included cigarettes. She has never used smokeless tobacco. She reports that she does not currently use alcohol. She reports that she does not use drugs.      OBJECTIVE     Physical Exam: There were no vitals filed for this visit. Gen: No apparent distress, A&O x 3.  Detailed Urogynecologic Evaluation:  Deferred. Prior exam showed:  No flowsheet data found.     ASSESSMENT AND PLAN    Allison Shaw is a 25 y.o. with:  No diagnosis found.

## 2021-07-23 ENCOUNTER — Ambulatory Visit: Payer: Medicaid Other | Admitting: Obstetrics and Gynecology

## 2021-08-31 ENCOUNTER — Telehealth: Payer: Self-pay | Admitting: Family Medicine

## 2021-08-31 DIAGNOSIS — Z111 Encounter for screening for respiratory tuberculosis: Secondary | ICD-10-CM

## 2021-08-31 NOTE — Telephone Encounter (Signed)
Patient called in saying she needs a mental health evaluation and tb shot for her job and they are requesting it by next week sometime.

## 2021-08-31 NOTE — Telephone Encounter (Signed)
Spoke with patient. Pt does not have PCP. 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 States this is dependant on her completing paperwork and having these tests performed. Explained that our [facility] is out of the office today and will return tomorrow. Explained I will review with [facility] and reach back out to patient to notify her if this is something we can help her with. Crissie Reese, MD gives verbal order for Quantiferon TB test. Pt can be scheduled for lab visit to have this completed.

## 2021-09-01 NOTE — Telephone Encounter (Signed)
Called patient to offer lab visit; scheduled for tomorrow, 09/02/21 at 1030. Per As[facility] she will not be able to complete mental health evaluation but will contact patient to explain this process. Notified patient to expect Jamie's call.

## 2021-09-02 ENCOUNTER — Other Ambulatory Visit: Payer: Medicaid Other

## 2021-09-02 ENCOUNTER — Other Ambulatory Visit: Payer: Self-pay

## 2021-09-02 DIAGNOSIS — Z111 Encounter for screening for respiratory tuberculosis: Secondary | ICD-10-CM

## 2022-04-14 ENCOUNTER — Other Ambulatory Visit: Payer: Self-pay

## 2022-04-14 ENCOUNTER — Ambulatory Visit (INDEPENDENT_AMBULATORY_CARE_PROVIDER_SITE_OTHER): Payer: Medicaid Other | Admitting: Family Medicine

## 2022-04-14 ENCOUNTER — Encounter: Payer: Self-pay | Admitting: Family Medicine

## 2022-04-14 VITALS — BP 115/77 | HR 75 | Wt 134.9 lb

## 2022-04-14 DIAGNOSIS — Z30432 Encounter for removal of intrauterine contraceptive device: Secondary | ICD-10-CM | POA: Diagnosis not present

## 2022-04-14 NOTE — Progress Notes (Signed)
    GYNECOLOGY OFFICE PROCEDURE NOTE  Allison Shaw is a 26 y.o. G2P1002 here for Liletta IUD removal. No GYN concerns.  Last pap smear was on 10/06/2020 and was normal.  IUD Removal  Patient identified, informed consent performed, consent signed.  Patient was placed in the dorsal lithotomy position, normal external genitalia was noted.  A speculum was placed in the patient's vagina, normal discharge was noted, no lesions. The cervix was visualized, no lesions, no abnormal discharge.  The strings of the IUD were grasped and pulled using ring forceps. The IUD was removed in its entirety. Patient tolerated the procedure well.    Patient will plans for pregnancy soon and she was told to avoid teratogens, take PNV and folic acid.  Routine preventative health maintenance measures emphasized.  Reva Bores, MD 04/14/2022 11:29 AM

## 2022-06-06 ENCOUNTER — Encounter: Payer: Self-pay | Admitting: *Deleted

## 2022-07-27 ENCOUNTER — Ambulatory Visit (INDEPENDENT_AMBULATORY_CARE_PROVIDER_SITE_OTHER): Payer: Medicaid Other

## 2022-07-27 DIAGNOSIS — Z3200 Encounter for pregnancy test, result unknown: Secondary | ICD-10-CM

## 2022-07-27 DIAGNOSIS — Z3201 Encounter for pregnancy test, result positive: Secondary | ICD-10-CM

## 2022-07-27 LAB — POCT PREGNANCY, URINE: Preg Test, Ur: POSITIVE — AB

## 2022-07-27 NOTE — Progress Notes (Signed)
Possible Pregnancy  Here today for pregnancy confirmation. UPT in office today is positive. Pt reports first positive home UPT on 07/21/2022. Reviewed dating with patient:   LMP: 05/11/2022 - pt unsure about dating. States she is currently breastfeeding 8 month old son and periods were sporadic after IUD removal. Dating [place] scheduled for 08/16/2022.  EDD: 02/15/2023 11w 0d today  OB history reviewed. Reviewed medications and allergies with patient. Recommended pt begin prenatal vitamin and schedule prenatal care.  Janeece Agee, RN 07/27/2022  9:37 AM

## 2022-08-16 ENCOUNTER — Encounter: Payer: Self-pay | Admitting: Advanced Practice Midwife

## 2022-08-16 ENCOUNTER — Other Ambulatory Visit: Payer: Self-pay | Admitting: Obstetrics & Gynecology

## 2022-08-16 ENCOUNTER — Ambulatory Visit (INDEPENDENT_AMBULATORY_CARE_PROVIDER_SITE_OTHER): Payer: Medicaid Other

## 2022-08-16 ENCOUNTER — Ambulatory Visit (INDEPENDENT_AMBULATORY_CARE_PROVIDER_SITE_OTHER): Payer: Medicaid Other | Admitting: Advanced Practice Midwife

## 2022-08-16 DIAGNOSIS — Z3A01 Less than 8 weeks gestation of pregnancy: Secondary | ICD-10-CM | POA: Diagnosis not present

## 2022-08-16 DIAGNOSIS — Z3A13 13 weeks gestation of pregnancy: Secondary | ICD-10-CM

## 2022-08-16 DIAGNOSIS — Z3200 Encounter for pregnancy test, result unknown: Secondary | ICD-10-CM

## 2022-08-16 DIAGNOSIS — O26841 Uterine size-date discrepancy, first trimester: Secondary | ICD-10-CM

## 2022-08-16 NOTE — Progress Notes (Signed)
Ultrasounds Results Note  SUBJECTIVE HPI:  Ms. Cybill Uriegas is a 27 y.o. G3P2002 at [redacted]w[redacted]d by irreg LMP (Recently coming off birth control) who presents to [facility] for followup ultrasound results. The patient denies abdominal pain or vaginal bleeding.   Previous HCG's None  Previous US None   A pos  Repeat ultrasound was performed earlier today.   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   Social History   Socioeconomic History   Marital status: Married    Spouse name: Not on file   Number of children: Not on file   Years of education: Not on file   Highest education level: Not on file  Occupational History   Not on file  Tobacco Use   Smoking status: Former    Types: Cigarettes   Smokeless tobacco: Never   Tobacco comments:    stopped with pregnancy; around others who smoke  Vaping Use   Vaping Use: Former   Quit date: 02/22/2020  Substance and Sexual Activity   Alcohol use: Not Currently    Comment: occasionally; stopped when found out pregnant- drank alot on New Years    Drug use: No   Sexual activity: Yes    Comment: states missed a few days of the pill because not good pills ; stopped taking until end of October or early November  Other Topics Concern   Not on file  Social History Narrative   Not on file   Social Determinants of Health   Financial Resource Strain: Not on file  Food Insecurity: No Food Insecurity (06/16/2021)   Hunger Vital Sign    Worried About Running Out of Food in the Last Year: Never true    Ran Out of Food in the Last Year: Never true  Transportation Needs: No Transportation Needs (06/16/2021)   PRAPARE -  Hydrologist (Medical): No    Lack of Transportation (Non-Medical): No  Physical Activity: Not on file  Stress: Not on file  Social Connections: Not on file  Intimate Partner Violence: Not At Risk (10/06/2020)   Humiliation, Afraid, Rape, and Kick questionnaire    Fear of Current or Ex-Partner: No    Emotionally Abused: No    Physically Abused: No    Sexually Abused: No   Current Outpatient Medications on File Prior to Visit  Medication Sig Dispense Refill   ibuprofen (ADVIL) 600 MG tablet Take 1 tablet (600 mg total) by mouth every 6 (six) hours. (Patient not taking: Reported on 04/14/2022) 30 tablet 0   iron polysaccharides (NIFEREX) 150 MG capsule Take 1 capsule (150 mg total) by mouth daily. (Patient not taking: Reported on 06/18/2021) 30 capsule 0   No current facility-administered medications on file prior to visit.   Allergies  Allergen Reactions   Latex Itching and Other (See Comments)    Reaction:  Burning    Fish Allergy Rash    I have reviewed patient's Past Medical Hx, Surgical Hx, Family Hx, Social Hx, medications and allergies.   Review of Systems Review of Systems  Constitutional: Negative for fever and  chills.  Gastrointestinal: Negative for abdominal pain.  Genitourinary: Negative for vaginal bleeding.  Musculoskeletal: Negative for back pain.  Neurological: Negative for dizziness and weakness.    Physical Exam  LMP 05/11/2022   Patient's last menstrual period was 05/11/2022. GENERAL: Well-developed, well-nourished female in no acute distress.  HEENT: Normocephalic, atraumatic.   LUNGS: Effort normal ABDOMEN: Deferred HEART: Regular rate  SKIN: Warm, dry and without erythema PSYCH: Normal mood and affect NEURO: Alert and oriented x 4  LAB RESULTS No results found for this or any previous visit (from the past 24 hour(s)).  IMAGING 7.1 week live SIUP. Small Experiment. 2.8 cm simple cyst.    ASSESSMENT 1. [redacted] weeks gestation of  pregnancy   2. Uterine size date discrepancy pregnancy, first trimester     PLAN Start prenatal care with provider of your choice. Plans to go to provider in [facility]. List of Ob/Gyn providers given. Change EDD based of [place] today. Patient advised to start/continue taking prenatal vitamins Go to MAU as needed for heavy bleeding, abdominal pain or fever greater than 100.4.  [place], [place] 08/16/2022 9:29 AM

## 2022-08-16 NOTE — Patient Instructions (Signed)
[facility]   [facility]             [address], [place], [place] [address] [phone]  [facility]                                                             [address], [address], [place], [place], [address] [phone]  [facility]                                    [address], [address], [place], [place], [address] [phone]  [facility] [place] [address], [place], [place], [address] [phone]  [facility]                                 [address], [place], [place], [address] [phone]  [facility]                                    [address], [place], [place], [address] [phone]  [facility] [address], [address], [place], [place], [address]                              [facility] [address], [address], [place], , [address] [phone]  [facility]         Phone: [phone]  [facility]      Phone: [phone]   [facility]      Phone: [phone]  [facility]         Phone: [phone]   [facility]    Phone: [phone]  [facility]      Phone: [phone]  [facility]     Phone: [phone]  [facility]        Phone: [phone]  [facility] ([facility]) [phone]  [facility]      Phone: [phone]   

## 2023-01-13 IMAGING — MR MR MRA HEAD W/O CM
11 of 12 series · 34 of 48 positions shown · non-contrast
Comparison: None available.

CLINICAL DATA: Initial evaluation for acute headache, currently
pregnant.

EXAM:
MRI HEAD WITHOUT CONTRAST
MRA HEAD WITHOUT CONTRAST
TECHNIQUE: Multiplanar, multi-echo pulse sequences of the brain and surrounding
structures were acquired without intravenous contrast. Angiographic
images of the Circle of Willis were acquired using MRA technique
without intravenous contrast.

[Series 10: DWI · axial · 3.0mm · 0.88mm/px · z∈[-20,+116]mm · 3 of 48 slices shown (1 of 3)]
[im 1/48]
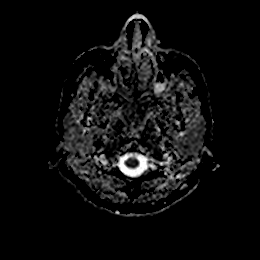
[im 24/48]
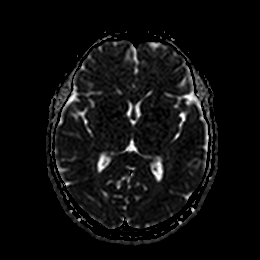
[im 48/48]
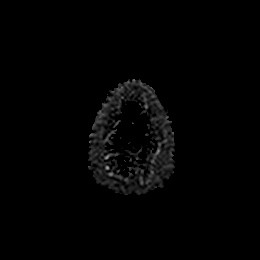

[Series 11: DWI · coronal · 4.0mm · 0.88mm/px · 5 of 70 slices shown (2 of 3)]
[im 1/70]
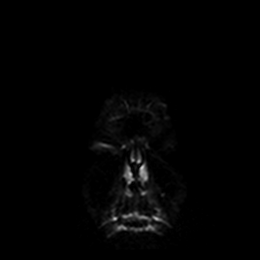
[im 18/70]
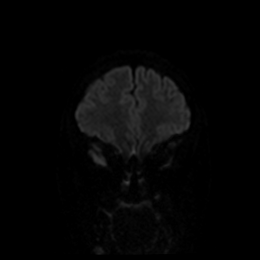
[im 35/70]
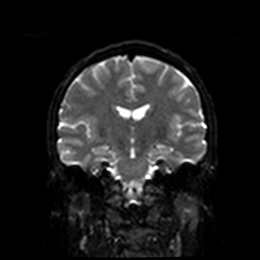
[im 52/70]
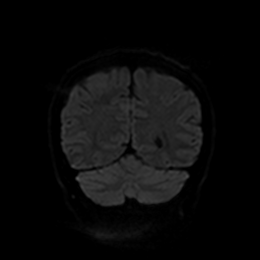
[im 70/70]
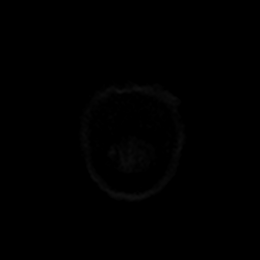

[Series 12: DWI · coronal · 4.0mm · 0.88mm/px · 3 of 35 slices shown (3 of 3)]
[im 1/35]
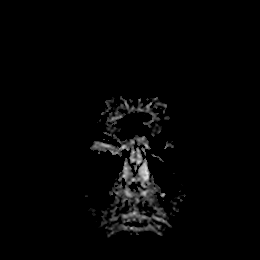
[im 18/35]
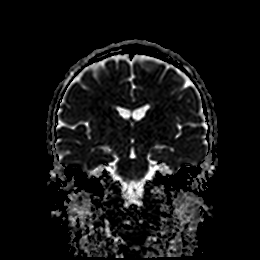
[im 35/35]
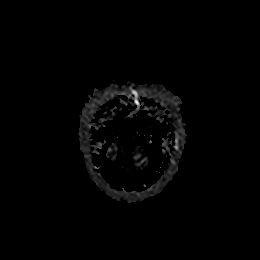

[Series 13: T1 · sagittal · 5.0mm · 0.75mm/px · 2 of 25 slices shown]
[im 1/25]
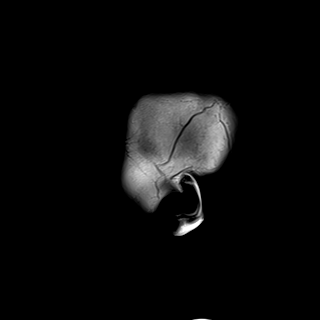
[im 25/25]
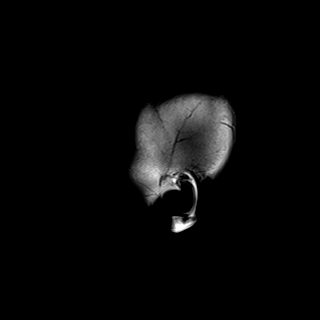

[Series 14: T2 · axial · 5.0mm · 0.72mm/px · z∈[-22,+118]mm · 2 of 25 slices shown (1 of 2)]
[im 1/25]
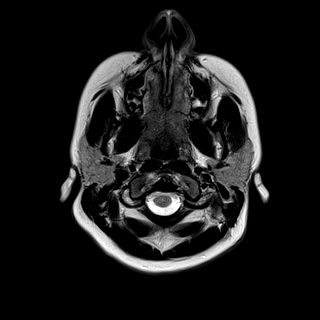
[im 25/25]
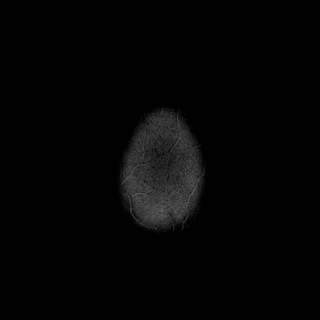

[Series 15: FLAIR · axial · 5.0mm · 0.45mm/px · z∈[-22,+117]mm · 2 of 25 slices shown]
[im 1/25]
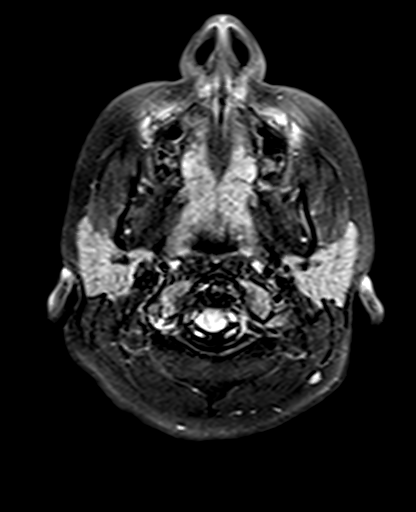
[im 25/25]
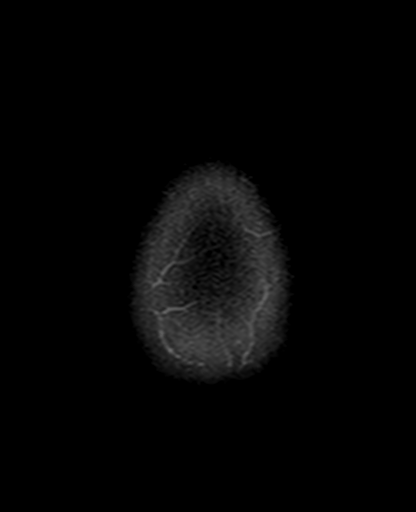

[Series 16: mag_images · axial · 3.0mm · 0.90mm/px · z∈[-29,+117]mm · 4 of 52 slices shown]
[im 1/52]
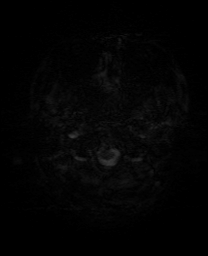
[im 18/52]
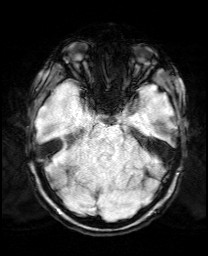
[im 35/52]
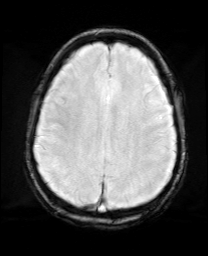
[im 52/52]
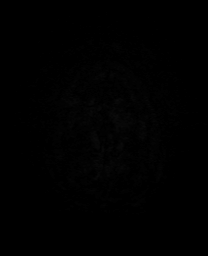

[Series 17: pha_images · axial · 3.0mm · 0.90mm/px · z∈[-29,+111]mm · 4 of 50 slices shown]
[im 1/50]
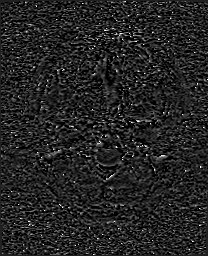
[im 17/50]
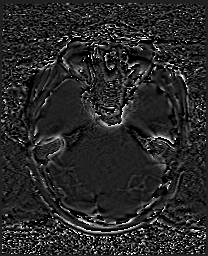
[im 33/50]
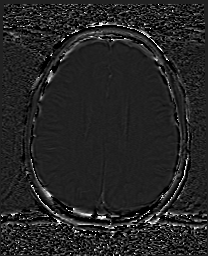
[im 50/50]
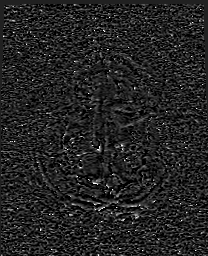

[Series 18: swi_images · axial · 3.0mm · 0.90mm/px · z∈[-29,+117]mm · 4 of 52 slices shown]
[im 1/52]
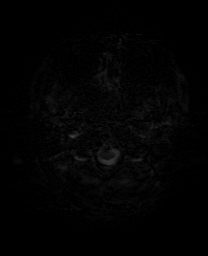
[im 18/52]
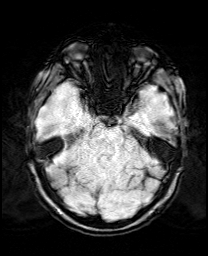
[im 35/52]
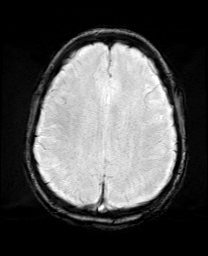
[im 52/52]
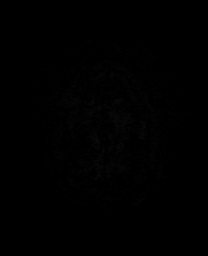

[Series 19: mip_images(sw) · axial · 24.0mm · 0.90mm/px · z∈[-19,+107]mm · 3 of 45 slices shown]
[im 1/45]
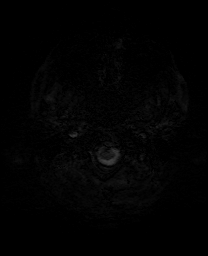
[im 23/45]
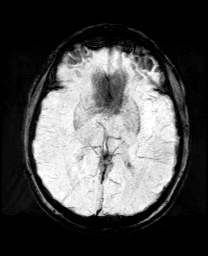
[im 45/45]
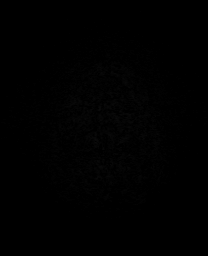

[Series 21: T2 · coronal · 5.0mm · 0.34mm/px · 2 of 29 slices shown (2 of 2)]
[im 1/29]
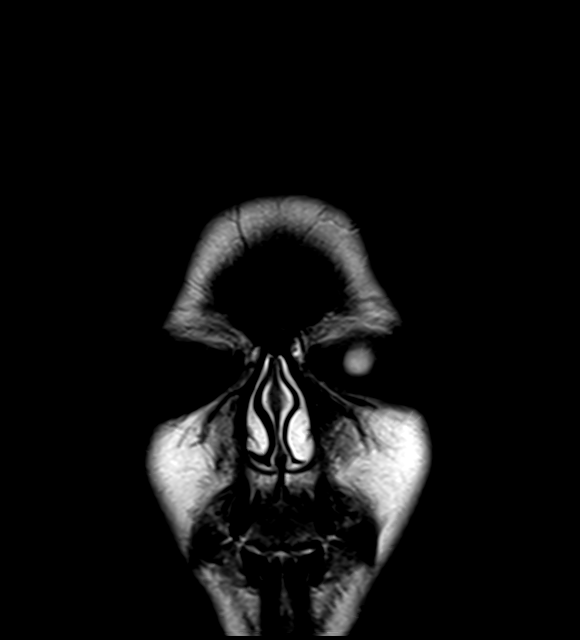
[im 29/29]
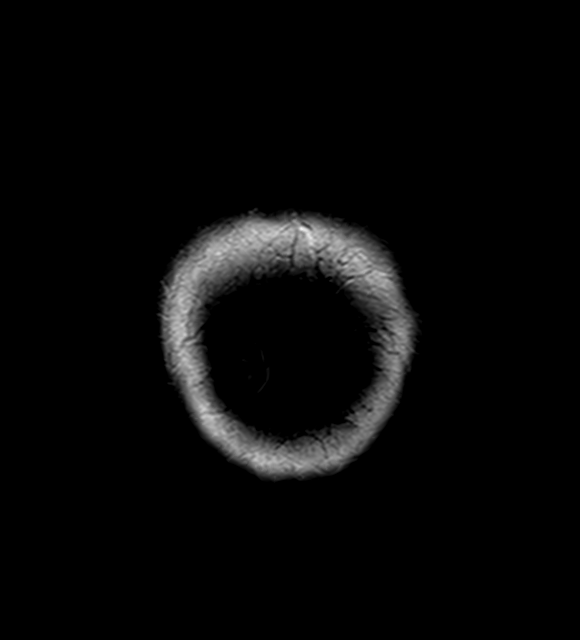

[34 of 48 positions shown; findings below may reference images not displayed]

FINDINGS: MRI HEAD FINDINGS

Brain: Cerebral volume within normal limits for patient age. No
focal parenchymal signal abnormality identified.

No abnormal foci of restricted diffusion to suggest acute or
subacute ischemia. Gray-white matter differentiation well
maintained. No encephalomalacia to suggest chronic infarction. No
foci of susceptibility artifact to suggest acute or chronic
intracranial hemorrhage.

No mass lesion, midline shift or mass effect. No hydrocephalus. No
extra-axial fluid collection.

Pituitary gland and suprasellar region are normal. Midline
structures intact and normal.

Vascular: Major intracranial vascular flow voids well maintained.
Major dural sinuses appear grossly patent.

Skull and upper cervical spine: Craniocervical junction normal.
Visualized upper cervical spine within normal limits. Diffusely
decreased T1 signal intensity seen within the visualized bone
marrow, likely related to current pregnancy. No focal marrow
replacing lesion. No scalp soft tissue abnormality.

Sinuses/Orbits: Globes and orbital soft tissues within normal
limits.

Paranasal sinuses are clear. No mastoid effusion. Inner ear
structures normal.

Other: None.

MRA HEAD FINDINGS

Anterior circulation: Both internal carotid arteries widely patent
to the termini without stenosis. A1 segments widely patent. Normal
anterior communicating artery complex. Both anterior cerebral
arteries widely patent to their distal aspects without stenosis. No
M1 stenosis or occlusion. Normal MCA bifurcations. Distal MCA
branches well perfused and symmetric.

Posterior circulation: Both V4 segments patent to the
vertebrobasilar junction without stenosis. Left PICA origin patent
and normal. Right PICA not visualized. Basilar widely patent to its
distal aspect without stenosis. Superior cerebellar arteries patent
bilaterally. Both PCAs primarily supplied via the basilar and are
well perfused to there distal aspects.

Anatomic variants: None significant. No aneurysm or other vascular
abnormality.
IMPRESSION: 1. Normal brain MRI. No acute intracranial abnormality or findings
to explain patient's symptoms identified.
2. Normal intracranial MRA.
